# Patient Record
Sex: Female | Born: 1990 | Race: White | Hispanic: No | Marital: Married | Smoking: Never smoker
Health system: Southern US, Community
[De-identification: ages and names within clinical notes are randomized; demographics above are authoritative.]

## PROBLEM LIST (undated history)

## (undated) ENCOUNTER — Inpatient Hospital Stay (HOSPITAL_COMMUNITY): Payer: Self-pay

## (undated) ENCOUNTER — Inpatient Hospital Stay (HOSPITAL_COMMUNITY): Payer: Managed Care, Other (non HMO)

## (undated) DIAGNOSIS — F419 Anxiety disorder, unspecified: Secondary | ICD-10-CM

## (undated) DIAGNOSIS — Z9889 Other specified postprocedural states: Secondary | ICD-10-CM

## (undated) DIAGNOSIS — Z789 Other specified health status: Secondary | ICD-10-CM

## (undated) DIAGNOSIS — Z98891 History of uterine scar from previous surgery: Secondary | ICD-10-CM

## (undated) DIAGNOSIS — Z3493 Encounter for supervision of normal pregnancy, unspecified, third trimester: Secondary | ICD-10-CM

## (undated) DIAGNOSIS — Z9851 Tubal ligation status: Secondary | ICD-10-CM

## (undated) HISTORY — PX: APPENDECTOMY: SHX54

---
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
## 2010-04-22 ENCOUNTER — Ambulatory Visit (HOSPITAL_COMMUNITY): Payer: Self-pay | Admitting: Psychiatry

## 2010-07-06 ENCOUNTER — Ambulatory Visit (HOSPITAL_COMMUNITY): Payer: Self-pay | Admitting: Psychiatry

## 2010-08-23 ENCOUNTER — Ambulatory Visit (HOSPITAL_COMMUNITY): Payer: Self-pay | Admitting: Psychiatry

## 2010-10-25 ENCOUNTER — Encounter (HOSPITAL_COMMUNITY): Payer: Self-pay | Admitting: Psychiatry

## 2010-10-28 ENCOUNTER — Other Ambulatory Visit (HOSPITAL_COMMUNITY): Payer: Self-pay | Admitting: Obstetrics and Gynecology

## 2010-10-28 DIAGNOSIS — O2690 Pregnancy related conditions, unspecified, unspecified trimester: Secondary | ICD-10-CM

## 2010-10-29 ENCOUNTER — Encounter (HOSPITAL_COMMUNITY): Payer: Self-pay

## 2010-10-29 ENCOUNTER — Other Ambulatory Visit (HOSPITAL_COMMUNITY): Payer: Self-pay | Admitting: Obstetrics and Gynecology

## 2010-10-29 ENCOUNTER — Ambulatory Visit (HOSPITAL_COMMUNITY)
Admission: RE | Admit: 2010-10-29 | Discharge: 2010-10-29 | Disposition: A | Discharge status: Home / Self Care | Payer: Medicaid Other | Source: Ambulatory Visit | Attending: Obstetrics and Gynecology | Admitting: Obstetrics and Gynecology

## 2010-10-29 ENCOUNTER — Other Ambulatory Visit (HOSPITAL_COMMUNITY): Payer: Medicaid Other

## 2010-10-29 DIAGNOSIS — O358XX0 Maternal care for other (suspected) fetal abnormality and damage, not applicable or unspecified: Secondary | ICD-10-CM | POA: Insufficient documentation

## 2010-10-29 DIAGNOSIS — O2690 Pregnancy related conditions, unspecified, unspecified trimester: Secondary | ICD-10-CM

## 2010-10-29 DIAGNOSIS — Z3689 Encounter for other specified antenatal screening: Secondary | ICD-10-CM | POA: Insufficient documentation

## 2010-10-29 DIAGNOSIS — B668 Other specified fluke infections: Secondary | ICD-10-CM

## 2010-11-25 ENCOUNTER — Ambulatory Visit (HOSPITAL_COMMUNITY): Admission: RE | Admit: 2010-11-25 | Payer: Medicaid Other | Source: Ambulatory Visit

## 2010-11-25 ENCOUNTER — Ambulatory Visit (HOSPITAL_COMMUNITY): Payer: Medicaid Other

## 2010-11-25 ENCOUNTER — Other Ambulatory Visit (HOSPITAL_COMMUNITY): Payer: Self-pay | Admitting: Obstetrics and Gynecology

## 2010-11-25 ENCOUNTER — Ambulatory Visit (HOSPITAL_COMMUNITY)
Admission: RE | Admit: 2010-11-25 | Discharge: 2010-11-25 | Disposition: A | Discharge status: Home / Self Care | Payer: Medicaid Other | Source: Ambulatory Visit | Attending: Obstetrics and Gynecology | Admitting: Obstetrics and Gynecology

## 2010-11-25 DIAGNOSIS — Z1389 Encounter for screening for other disorder: Secondary | ICD-10-CM | POA: Insufficient documentation

## 2010-11-25 DIAGNOSIS — Z363 Encounter for antenatal screening for malformations: Secondary | ICD-10-CM | POA: Insufficient documentation

## 2010-11-25 DIAGNOSIS — B668 Other specified fluke infections: Secondary | ICD-10-CM

## 2010-11-25 DIAGNOSIS — O358XX0 Maternal care for other (suspected) fetal abnormality and damage, not applicable or unspecified: Secondary | ICD-10-CM | POA: Insufficient documentation

## 2010-12-01 ENCOUNTER — Other Ambulatory Visit (HOSPITAL_COMMUNITY): Payer: Medicaid Other

## 2010-12-31 ENCOUNTER — Inpatient Hospital Stay (HOSPITAL_COMMUNITY): Payer: Medicaid Other

## 2010-12-31 ENCOUNTER — Inpatient Hospital Stay (HOSPITAL_COMMUNITY)
Admission: AD | Admit: 2010-12-31 | Discharge: 2010-12-31 | Disposition: A | Discharge status: Home / Self Care | Payer: Medicaid Other | Source: Ambulatory Visit | Attending: Obstetrics and Gynecology | Admitting: Obstetrics and Gynecology

## 2010-12-31 DIAGNOSIS — O209 Hemorrhage in early pregnancy, unspecified: Secondary | ICD-10-CM | POA: Insufficient documentation

## 2011-01-06 ENCOUNTER — Other Ambulatory Visit (HOSPITAL_COMMUNITY): Payer: Self-pay | Admitting: Obstetrics and Gynecology

## 2011-01-06 ENCOUNTER — Ambulatory Visit (HOSPITAL_COMMUNITY)
Admission: RE | Admit: 2011-01-06 | Discharge: 2011-01-06 | Disposition: A | Discharge status: Home / Self Care | Payer: Medicaid Other | Source: Ambulatory Visit | Attending: Obstetrics and Gynecology | Admitting: Obstetrics and Gynecology

## 2011-01-06 DIAGNOSIS — O358XX0 Maternal care for other (suspected) fetal abnormality and damage, not applicable or unspecified: Secondary | ICD-10-CM | POA: Insufficient documentation

## 2011-01-06 DIAGNOSIS — Z3689 Encounter for other specified antenatal screening: Secondary | ICD-10-CM | POA: Insufficient documentation

## 2011-01-06 DIAGNOSIS — B668 Other specified fluke infections: Secondary | ICD-10-CM

## 2011-02-03 ENCOUNTER — Ambulatory Visit (HOSPITAL_COMMUNITY)
Admission: RE | Admit: 2011-02-03 | Discharge: 2011-02-03 | Disposition: A | Discharge status: Home / Self Care | Payer: Medicaid Other | Source: Ambulatory Visit | Attending: Obstetrics and Gynecology | Admitting: Obstetrics and Gynecology

## 2011-02-03 ENCOUNTER — Other Ambulatory Visit (HOSPITAL_COMMUNITY): Payer: Self-pay | Admitting: Obstetrics and Gynecology

## 2011-02-03 DIAGNOSIS — O358XX0 Maternal care for other (suspected) fetal abnormality and damage, not applicable or unspecified: Secondary | ICD-10-CM | POA: Insufficient documentation

## 2011-02-03 DIAGNOSIS — B668 Other specified fluke infections: Secondary | ICD-10-CM

## 2011-02-08 ENCOUNTER — Inpatient Hospital Stay (HOSPITAL_COMMUNITY)
Admission: AD | Admit: 2011-02-08 | Discharge: 2011-02-08 | Disposition: A | Discharge status: Home / Self Care | Payer: Medicaid Other | Source: Ambulatory Visit | Attending: Obstetrics and Gynecology | Admitting: Obstetrics and Gynecology

## 2011-02-08 ENCOUNTER — Other Ambulatory Visit (HOSPITAL_COMMUNITY): Payer: Self-pay | Admitting: Obstetrics and Gynecology

## 2011-02-08 ENCOUNTER — Ambulatory Visit (HOSPITAL_COMMUNITY): Payer: Medicaid Other

## 2011-02-08 ENCOUNTER — Ambulatory Visit (HOSPITAL_COMMUNITY)
Admission: RE | Admit: 2011-02-08 | Discharge: 2011-02-08 | Disposition: A | Discharge status: Home / Self Care | Payer: Medicaid Other | Source: Ambulatory Visit | Attending: Obstetrics and Gynecology | Admitting: Obstetrics and Gynecology

## 2011-02-08 DIAGNOSIS — O99891 Other specified diseases and conditions complicating pregnancy: Secondary | ICD-10-CM | POA: Insufficient documentation

## 2011-02-08 DIAGNOSIS — O4100X0 Oligohydramnios, unspecified trimester, not applicable or unspecified: Secondary | ICD-10-CM | POA: Insufficient documentation

## 2011-02-08 DIAGNOSIS — B668 Other specified fluke infections: Secondary | ICD-10-CM

## 2011-02-08 DIAGNOSIS — O358XX0 Maternal care for other (suspected) fetal abnormality and damage, not applicable or unspecified: Secondary | ICD-10-CM | POA: Insufficient documentation

## 2011-02-08 DIAGNOSIS — O365990 Maternal care for other known or suspected poor fetal growth, unspecified trimester, not applicable or unspecified: Secondary | ICD-10-CM | POA: Insufficient documentation

## 2011-02-08 LAB — AMNISURE RUPTURE OF MEMBRANE (ROM) NOT AT ARMC: Amnisure ROM: NEGATIVE

## 2011-02-09 ENCOUNTER — Inpatient Hospital Stay (HOSPITAL_COMMUNITY)
Admission: AD | Admit: 2011-02-09 | Discharge: 2011-02-09 | Disposition: A | Discharge status: Home / Self Care | Payer: Medicaid Other | Source: Ambulatory Visit | Attending: Obstetrics and Gynecology | Admitting: Obstetrics and Gynecology

## 2011-02-09 DIAGNOSIS — O4700 False labor before 37 completed weeks of gestation, unspecified trimester: Secondary | ICD-10-CM | POA: Insufficient documentation

## 2011-02-15 ENCOUNTER — Other Ambulatory Visit (HOSPITAL_COMMUNITY): Payer: Medicaid Other

## 2011-02-15 ENCOUNTER — Ambulatory Visit (HOSPITAL_COMMUNITY)
Admission: RE | Admit: 2011-02-15 | Discharge: 2011-02-15 | Disposition: A | Discharge status: Home / Self Care | Payer: Medicaid Other | Source: Ambulatory Visit | Attending: Obstetrics and Gynecology | Admitting: Obstetrics and Gynecology

## 2011-02-15 DIAGNOSIS — B668 Other specified fluke infections: Secondary | ICD-10-CM

## 2011-02-15 DIAGNOSIS — O365990 Maternal care for other known or suspected poor fetal growth, unspecified trimester, not applicable or unspecified: Secondary | ICD-10-CM | POA: Insufficient documentation

## 2011-02-15 DIAGNOSIS — O4100X0 Oligohydramnios, unspecified trimester, not applicable or unspecified: Secondary | ICD-10-CM | POA: Insufficient documentation

## 2011-02-15 DIAGNOSIS — O358XX0 Maternal care for other (suspected) fetal abnormality and damage, not applicable or unspecified: Secondary | ICD-10-CM | POA: Insufficient documentation

## 2011-02-25 ENCOUNTER — Other Ambulatory Visit (HOSPITAL_COMMUNITY): Payer: Self-pay | Admitting: Obstetrics and Gynecology

## 2011-02-25 ENCOUNTER — Other Ambulatory Visit (HOSPITAL_COMMUNITY): Payer: Medicaid Other

## 2011-02-25 ENCOUNTER — Ambulatory Visit (HOSPITAL_COMMUNITY)
Admission: RE | Admit: 2011-02-25 | Discharge: 2011-02-25 | Disposition: A | Discharge status: Home / Self Care | Payer: Medicaid Other | Source: Ambulatory Visit | Attending: Obstetrics and Gynecology | Admitting: Obstetrics and Gynecology

## 2011-02-25 DIAGNOSIS — O358XX0 Maternal care for other (suspected) fetal abnormality and damage, not applicable or unspecified: Secondary | ICD-10-CM | POA: Insufficient documentation

## 2011-02-25 DIAGNOSIS — O4100X0 Oligohydramnios, unspecified trimester, not applicable or unspecified: Secondary | ICD-10-CM | POA: Insufficient documentation

## 2011-02-25 DIAGNOSIS — B668 Other specified fluke infections: Secondary | ICD-10-CM

## 2011-02-25 DIAGNOSIS — O365990 Maternal care for other known or suspected poor fetal growth, unspecified trimester, not applicable or unspecified: Secondary | ICD-10-CM | POA: Insufficient documentation

## 2011-02-25 DIAGNOSIS — O2690 Pregnancy related conditions, unspecified, unspecified trimester: Secondary | ICD-10-CM

## 2011-03-01 ENCOUNTER — Ambulatory Visit (HOSPITAL_COMMUNITY)
Admission: RE | Admit: 2011-03-01 | Discharge: 2011-03-01 | Disposition: A | Discharge status: Home / Self Care | Payer: Medicaid Other | Source: Ambulatory Visit | Attending: Obstetrics and Gynecology | Admitting: Obstetrics and Gynecology

## 2011-03-01 ENCOUNTER — Inpatient Hospital Stay (HOSPITAL_COMMUNITY)
Admission: AD | Admit: 2011-03-01 | Discharge: 2011-03-01 | Disposition: A | Discharge status: Home / Self Care | Payer: Medicaid Other | Source: Ambulatory Visit | Attending: Obstetrics and Gynecology | Admitting: Obstetrics and Gynecology

## 2011-03-01 DIAGNOSIS — B668 Other specified fluke infections: Secondary | ICD-10-CM

## 2011-03-01 DIAGNOSIS — F411 Generalized anxiety disorder: Secondary | ICD-10-CM | POA: Insufficient documentation

## 2011-03-01 DIAGNOSIS — O358XX0 Maternal care for other (suspected) fetal abnormality and damage, not applicable or unspecified: Secondary | ICD-10-CM | POA: Insufficient documentation

## 2011-03-01 DIAGNOSIS — O99340 Other mental disorders complicating pregnancy, unspecified trimester: Secondary | ICD-10-CM | POA: Insufficient documentation

## 2011-03-01 DIAGNOSIS — O4100X0 Oligohydramnios, unspecified trimester, not applicable or unspecified: Secondary | ICD-10-CM | POA: Insufficient documentation

## 2011-03-01 DIAGNOSIS — N390 Urinary tract infection, site not specified: Secondary | ICD-10-CM | POA: Insufficient documentation

## 2011-03-01 DIAGNOSIS — O2390 Unspecified genitourinary tract infection in pregnancy, unspecified trimester: Secondary | ICD-10-CM | POA: Insufficient documentation

## 2011-03-01 DIAGNOSIS — O365990 Maternal care for other known or suspected poor fetal growth, unspecified trimester, not applicable or unspecified: Secondary | ICD-10-CM | POA: Insufficient documentation

## 2011-03-01 LAB — URINE MICROSCOPIC-ADD ON

## 2011-03-01 LAB — URINALYSIS, ROUTINE W REFLEX MICROSCOPIC
Bilirubin Urine: NEGATIVE
Glucose, UA: NEGATIVE mg/dL
Ketones, ur: NEGATIVE mg/dL
Nitrite: NEGATIVE
pH: 6.5 (ref 5.0–8.0)

## 2011-03-02 ENCOUNTER — Other Ambulatory Visit (HOSPITAL_COMMUNITY): Payer: Medicaid Other

## 2011-03-03 LAB — URINE CULTURE

## 2011-03-04 ENCOUNTER — Other Ambulatory Visit (HOSPITAL_COMMUNITY): Payer: Self-pay | Admitting: Obstetrics and Gynecology

## 2011-03-04 ENCOUNTER — Ambulatory Visit (HOSPITAL_COMMUNITY)
Admission: RE | Admit: 2011-03-04 | Discharge: 2011-03-04 | Disposition: A | Discharge status: Home / Self Care | Payer: Medicaid Other | Source: Ambulatory Visit | Attending: Obstetrics and Gynecology | Admitting: Obstetrics and Gynecology

## 2011-03-04 DIAGNOSIS — Z3689 Encounter for other specified antenatal screening: Secondary | ICD-10-CM | POA: Insufficient documentation

## 2011-03-04 DIAGNOSIS — O4100X0 Oligohydramnios, unspecified trimester, not applicable or unspecified: Secondary | ICD-10-CM | POA: Insufficient documentation

## 2011-03-04 DIAGNOSIS — O358XX0 Maternal care for other (suspected) fetal abnormality and damage, not applicable or unspecified: Secondary | ICD-10-CM | POA: Insufficient documentation

## 2011-03-04 DIAGNOSIS — O2690 Pregnancy related conditions, unspecified, unspecified trimester: Secondary | ICD-10-CM

## 2011-03-04 DIAGNOSIS — O365990 Maternal care for other known or suspected poor fetal growth, unspecified trimester, not applicable or unspecified: Secondary | ICD-10-CM | POA: Insufficient documentation

## 2011-03-11 ENCOUNTER — Other Ambulatory Visit (HOSPITAL_COMMUNITY): Payer: Medicaid Other

## 2011-03-11 ENCOUNTER — Inpatient Hospital Stay (HOSPITAL_COMMUNITY)
Admission: AD | Admit: 2011-03-11 | Discharge: 2011-03-11 | Disposition: A | Discharge status: Home / Self Care | Payer: Medicaid Other | Source: Ambulatory Visit | Attending: Obstetrics and Gynecology | Admitting: Obstetrics and Gynecology

## 2011-03-11 ENCOUNTER — Ambulatory Visit (HOSPITAL_COMMUNITY)
Admission: RE | Admit: 2011-03-11 | Discharge: 2011-03-11 | Disposition: A | Discharge status: Home / Self Care | Payer: Medicaid Other | Source: Ambulatory Visit | Attending: Obstetrics and Gynecology | Admitting: Obstetrics and Gynecology

## 2011-03-11 DIAGNOSIS — O358XX0 Maternal care for other (suspected) fetal abnormality and damage, not applicable or unspecified: Secondary | ICD-10-CM | POA: Insufficient documentation

## 2011-03-11 DIAGNOSIS — O4100X0 Oligohydramnios, unspecified trimester, not applicable or unspecified: Secondary | ICD-10-CM | POA: Insufficient documentation

## 2011-03-11 DIAGNOSIS — O2690 Pregnancy related conditions, unspecified, unspecified trimester: Secondary | ICD-10-CM

## 2011-03-11 DIAGNOSIS — O365990 Maternal care for other known or suspected poor fetal growth, unspecified trimester, not applicable or unspecified: Secondary | ICD-10-CM | POA: Insufficient documentation

## 2011-03-11 DIAGNOSIS — O368390 Maternal care for abnormalities of the fetal heart rate or rhythm, unspecified trimester, not applicable or unspecified: Secondary | ICD-10-CM | POA: Insufficient documentation

## 2011-03-17 ENCOUNTER — Other Ambulatory Visit (HOSPITAL_COMMUNITY): Payer: Medicaid Other

## 2011-03-18 ENCOUNTER — Other Ambulatory Visit (HOSPITAL_COMMUNITY): Payer: Medicaid Other

## 2011-03-18 ENCOUNTER — Ambulatory Visit (HOSPITAL_COMMUNITY): Payer: Medicaid Other

## 2011-03-27 ENCOUNTER — Inpatient Hospital Stay (HOSPITAL_COMMUNITY)
Admission: AD | Admit: 2011-03-27 | Discharge: 2011-03-27 | Disposition: A | Discharge status: Home / Self Care | Payer: Medicaid Other | Source: Ambulatory Visit | Attending: Obstetrics and Gynecology | Admitting: Obstetrics and Gynecology

## 2011-03-27 ENCOUNTER — Encounter (HOSPITAL_COMMUNITY): Payer: Self-pay | Admitting: *Deleted

## 2011-03-27 DIAGNOSIS — O368190 Decreased fetal movements, unspecified trimester, not applicable or unspecified: Secondary | ICD-10-CM

## 2011-03-27 DIAGNOSIS — O358XX0 Maternal care for other (suspected) fetal abnormality and damage, not applicable or unspecified: Secondary | ICD-10-CM | POA: Diagnosis present

## 2011-03-27 DIAGNOSIS — Z3689 Encounter for other specified antenatal screening: Secondary | ICD-10-CM

## 2011-03-27 HISTORY — DX: Other specified health status: Z78.9
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
## 2011-03-27 NOTE — Progress Notes (Signed)
Pt states she has had decreased fetal movement and hasnt felt baby move for 2-3 hrs.  G1p0, Denies ROM or vaginal bleeding
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
## 2011-06-28 ENCOUNTER — Encounter (HOSPITAL_COMMUNITY): Payer: Self-pay | Admitting: *Deleted

## 2011-10-16 IMAGING — US US FETAL BPP W/O NONSTRESS
1 series · 14 of 19 positions shown · non-contrast
Comparison: none

[Series 1: us fetal bpp w/o nonstress · 19 acquisitions, 14 frames shown]
[im 1/19]
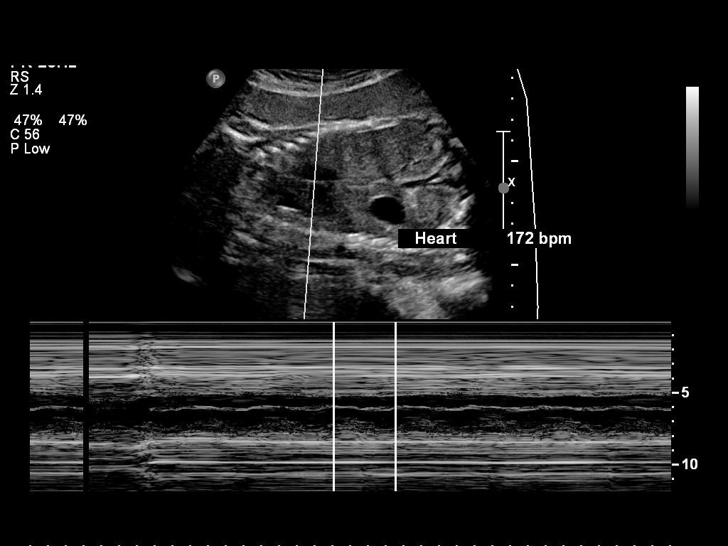
[im 3/19]
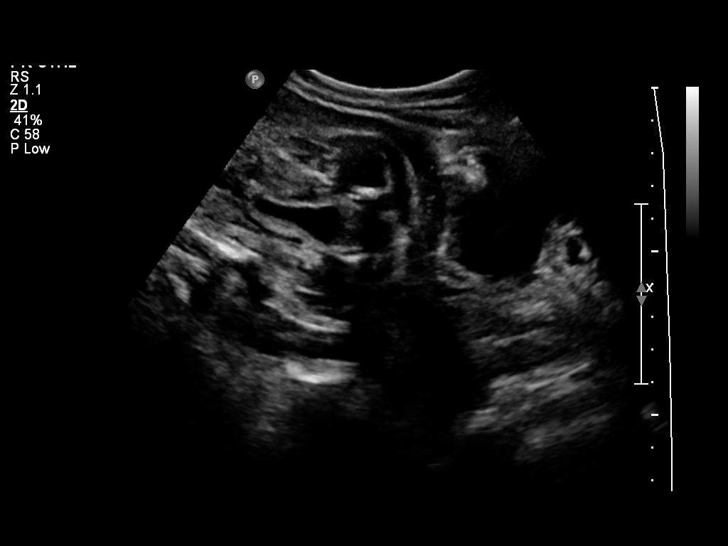
[im 4/19]
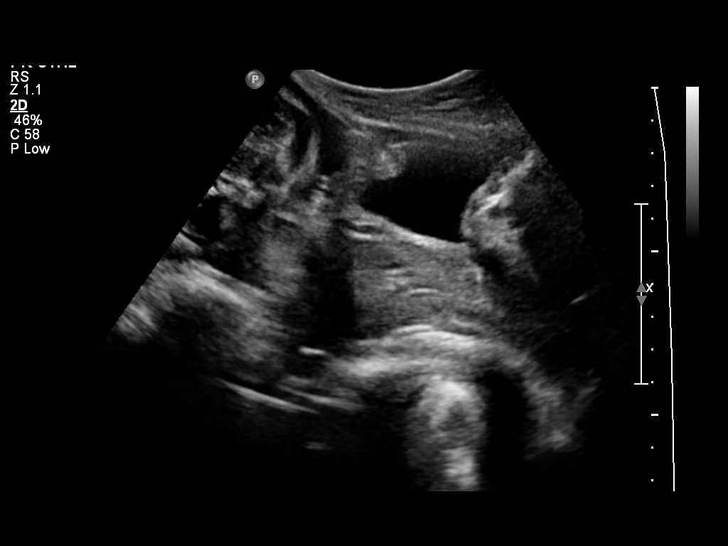
[im 5/19]
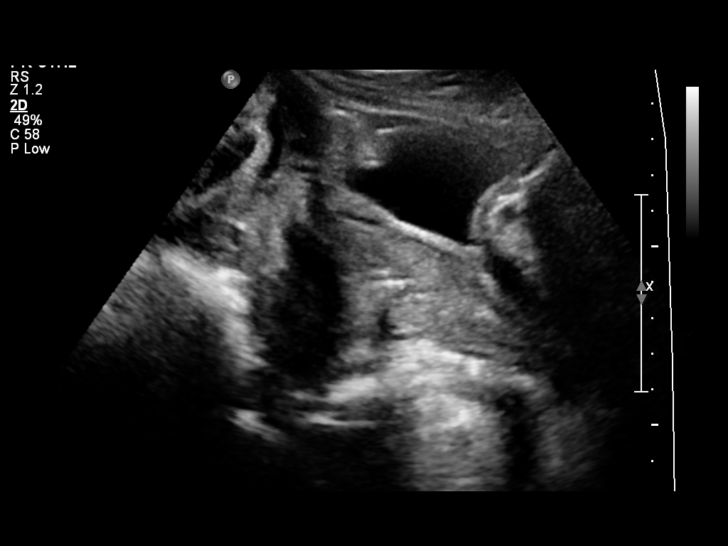
[im 7/19]
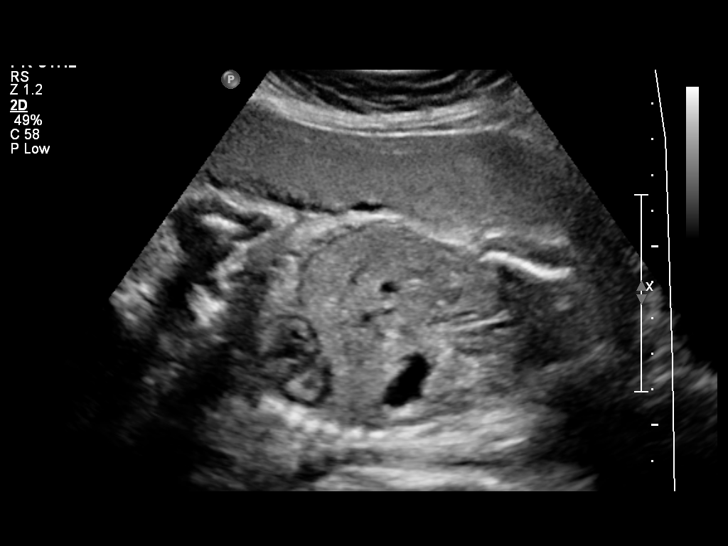
[im 8/19]
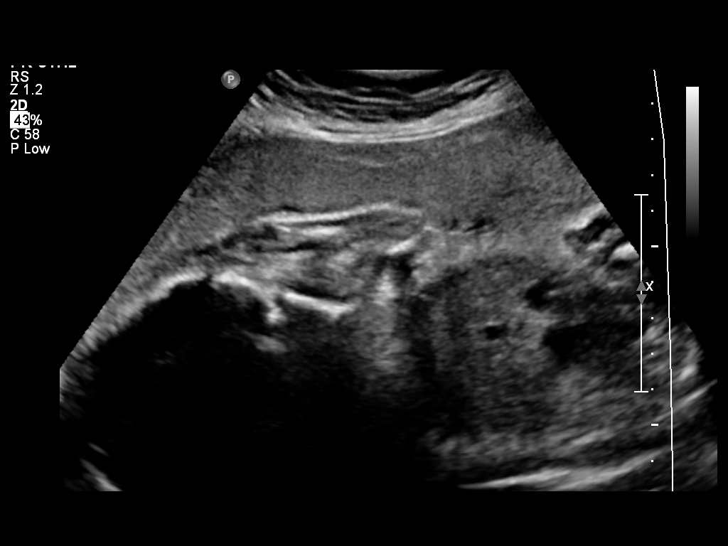
[im 9/19]
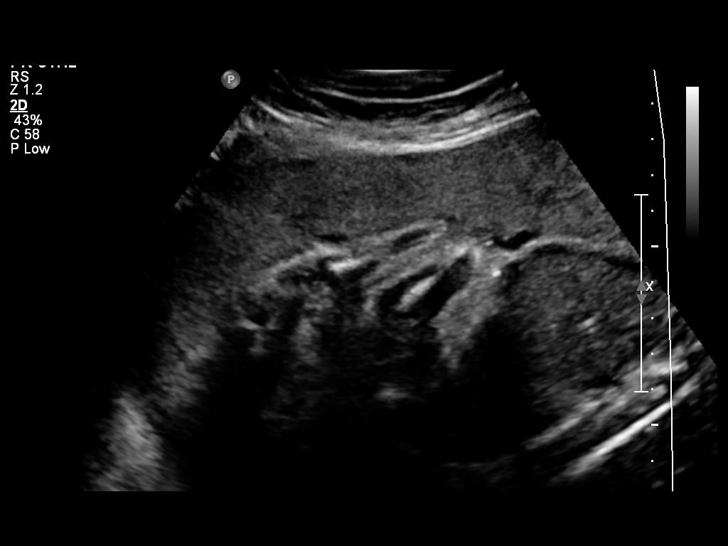
[im 11/19]
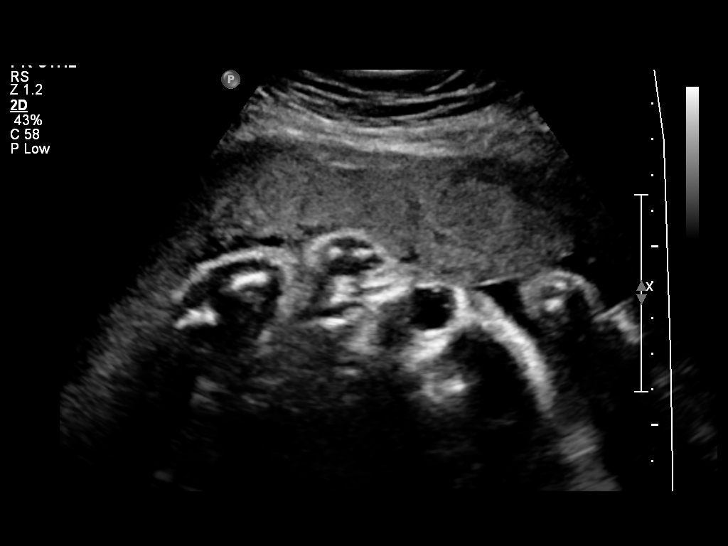
[im 12/19]
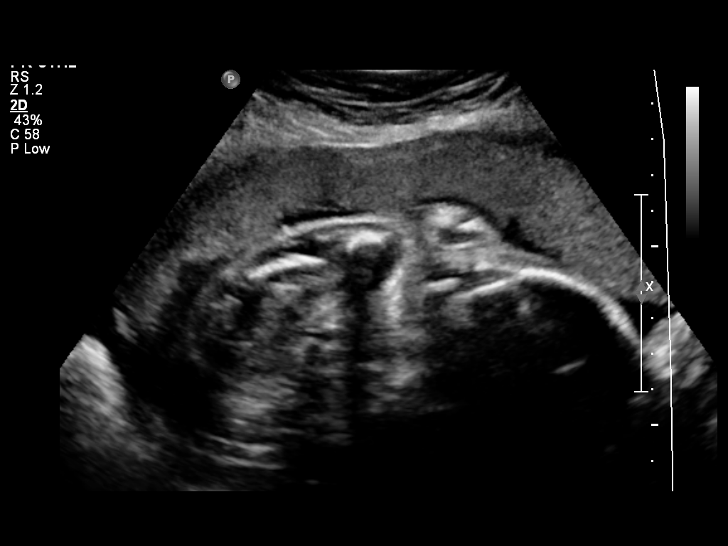
[im 13/19]
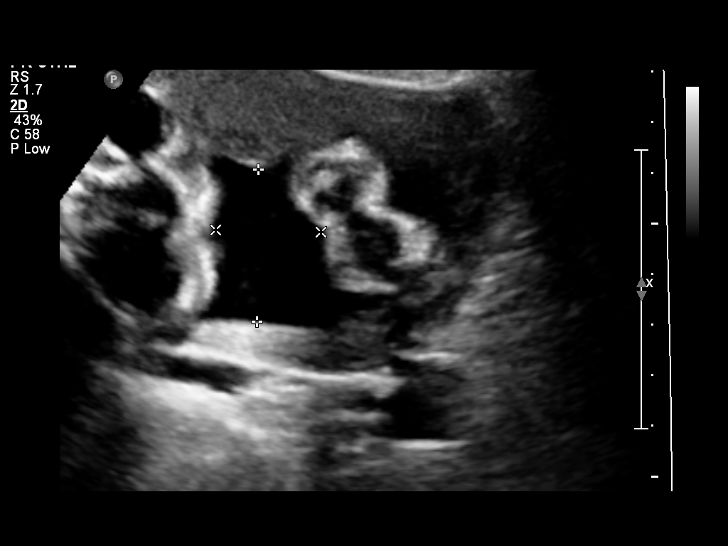
[im 15/19]
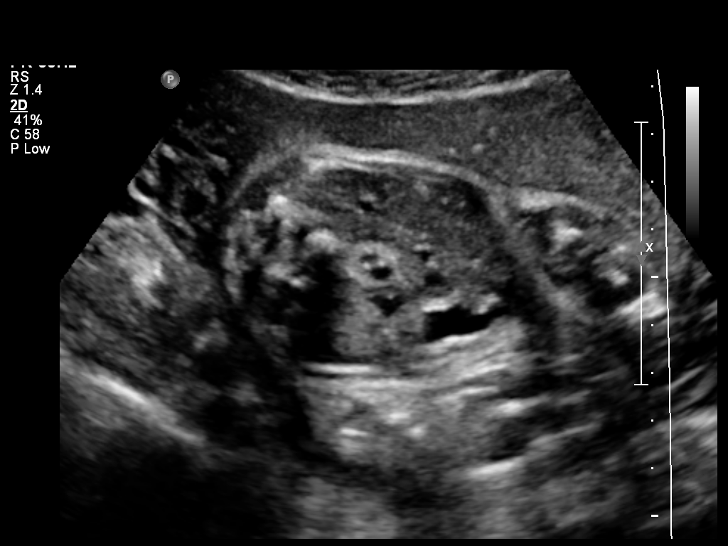
[im 16/19]
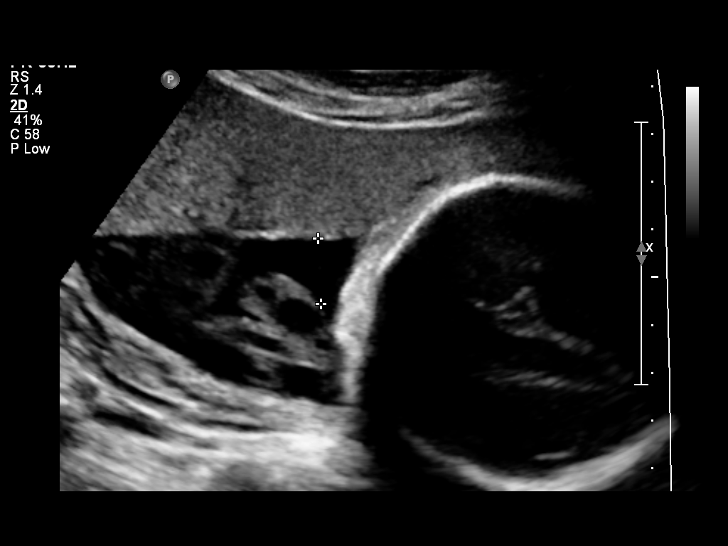
[im 17/19]
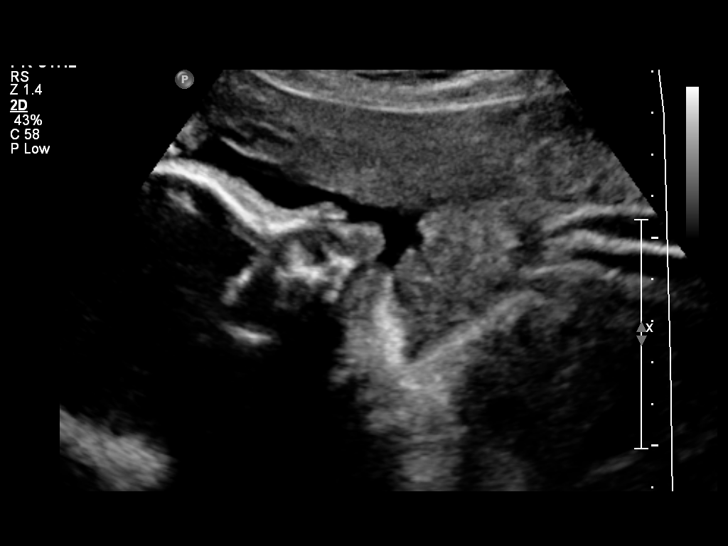
[im 19/19]
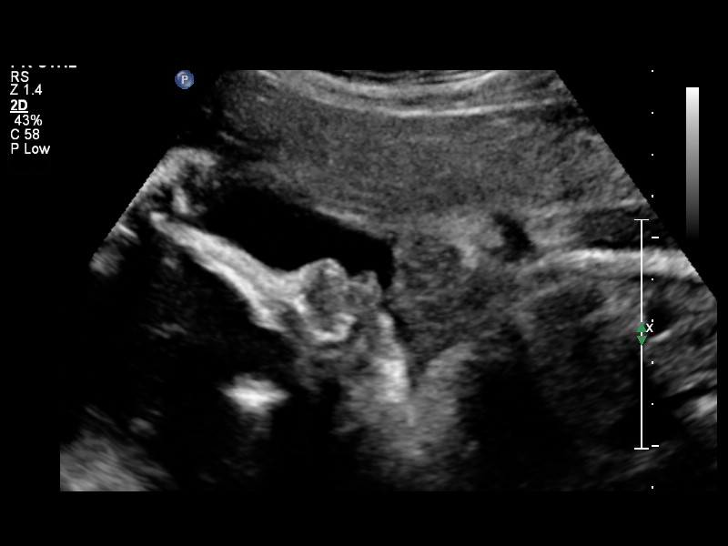

[14 of 19 positions shown; findings below may reference images not displayed]

Canned report from images found in remote index.

Refer to host system for actual result text.

## 2011-10-26 IMAGING — US US FETAL BPP W/O NONSTRESS
1 series · 14 of 19 positions shown · non-contrast
Comparison: none

[Series 1: us fetal bpp w/o nonstress · 0.25mm/px · 19 acquisitions, 14 frames shown]
[im 1/19]
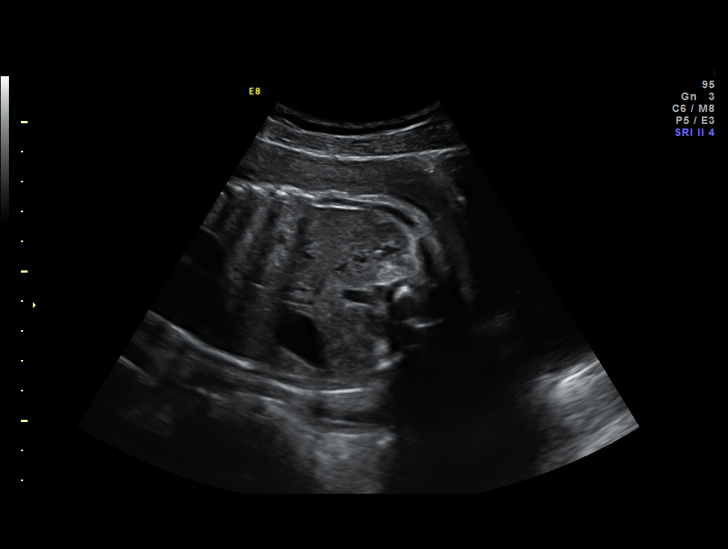
[im 3/19]
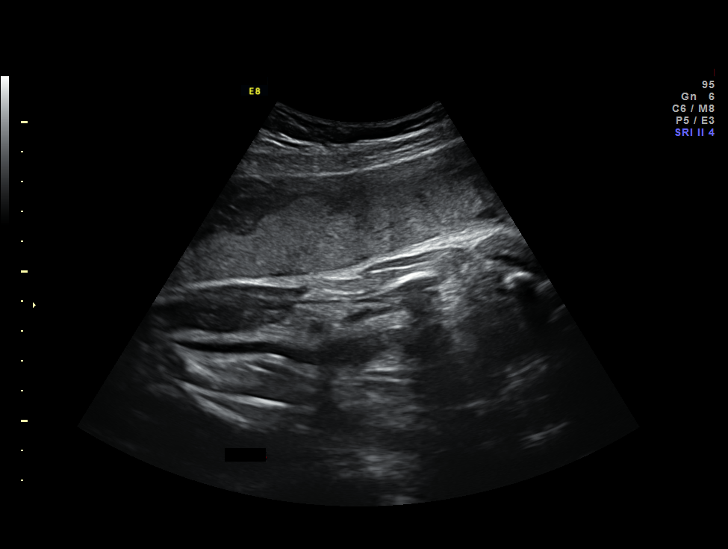
[im 4/19]
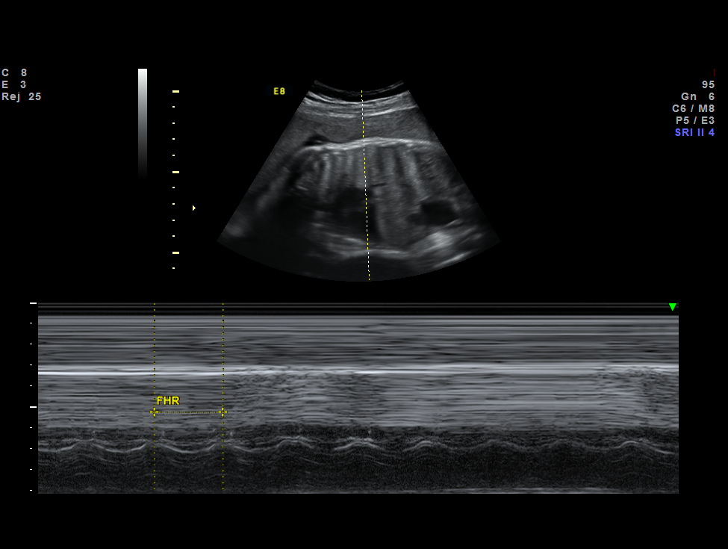
[im 5/19]
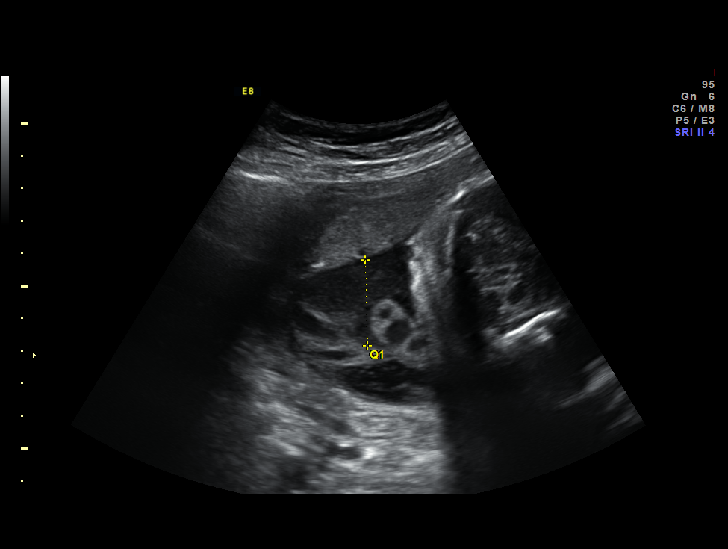
[im 7/19]
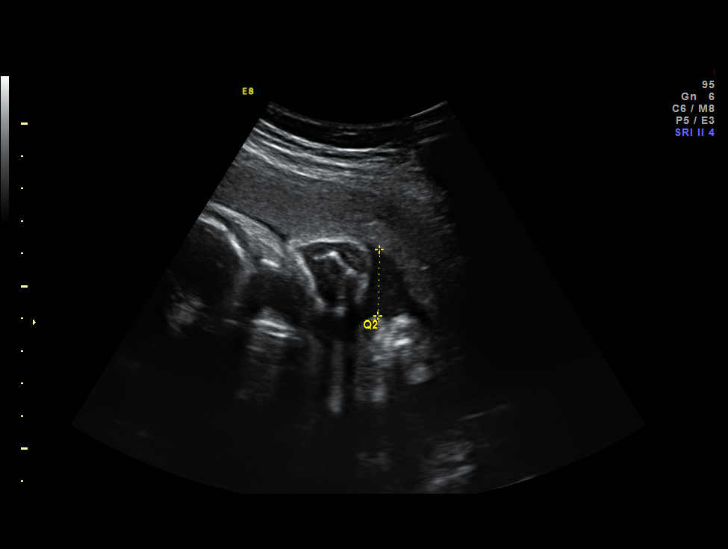
[im 8/19]
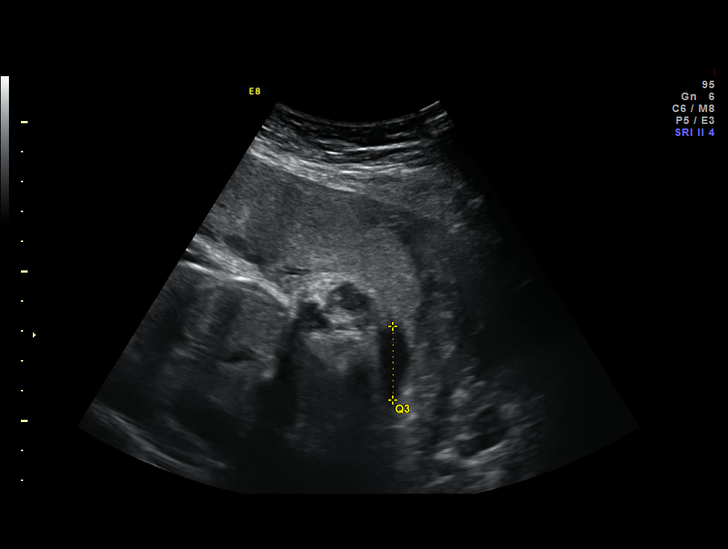
[im 9/19]
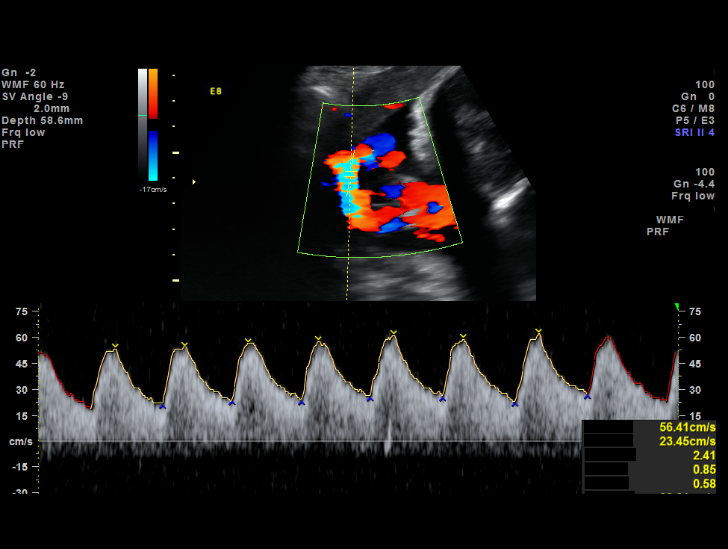
[im 11/19]
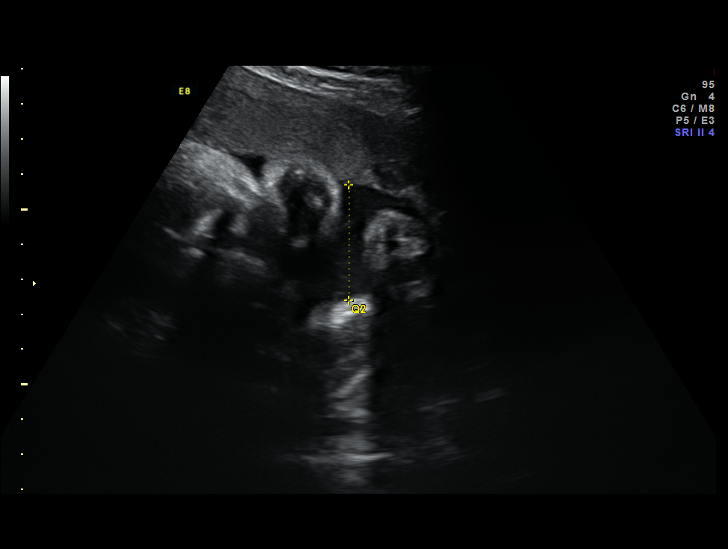
[im 12/19]
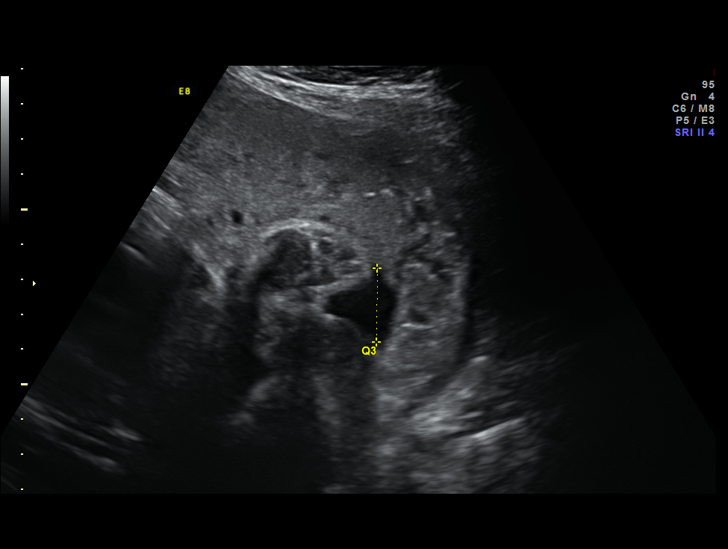
[im 13/19]
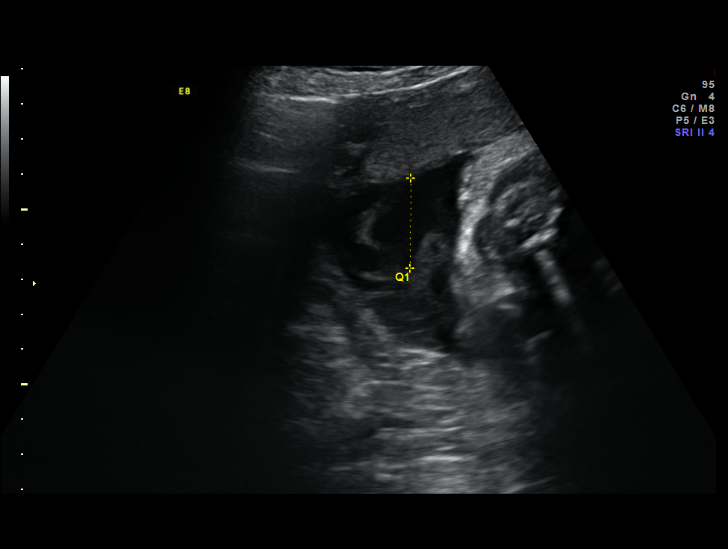
[im 15/19]
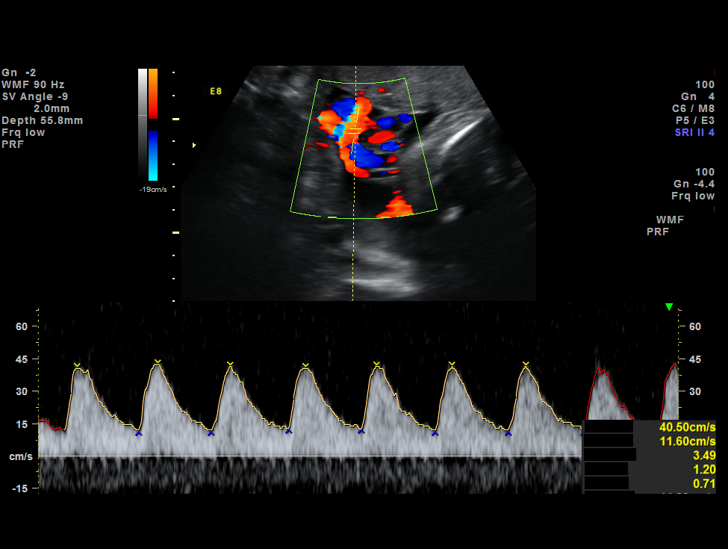
[im 16/19]
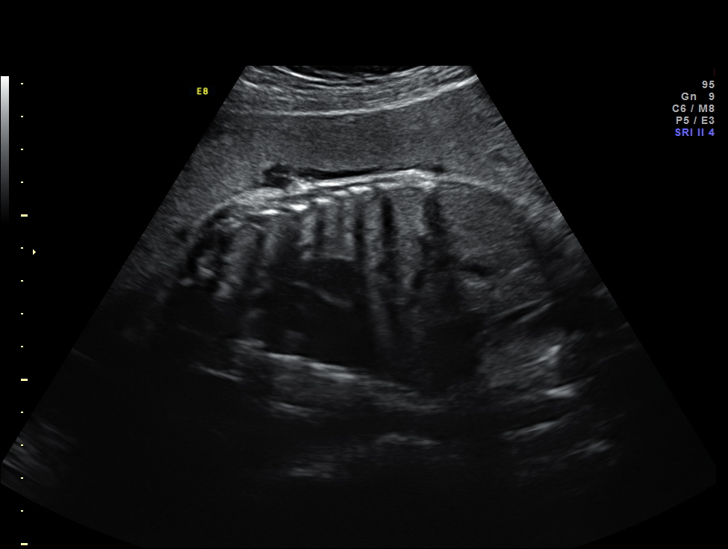
[im 17/19]
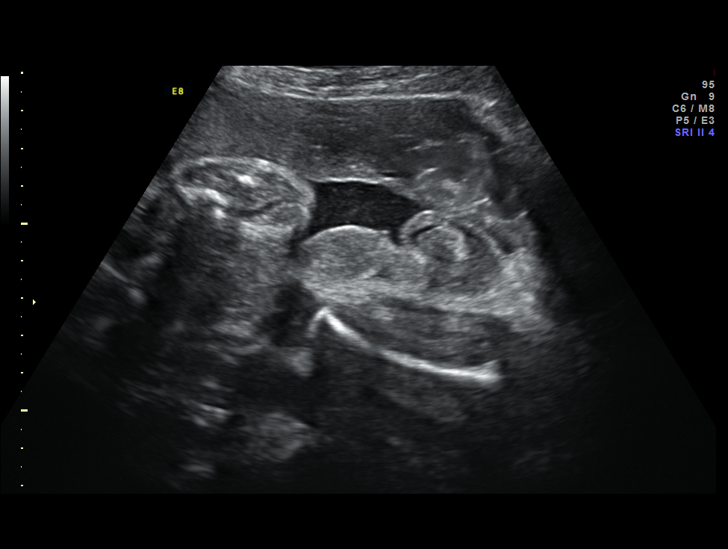
[im 19/19]
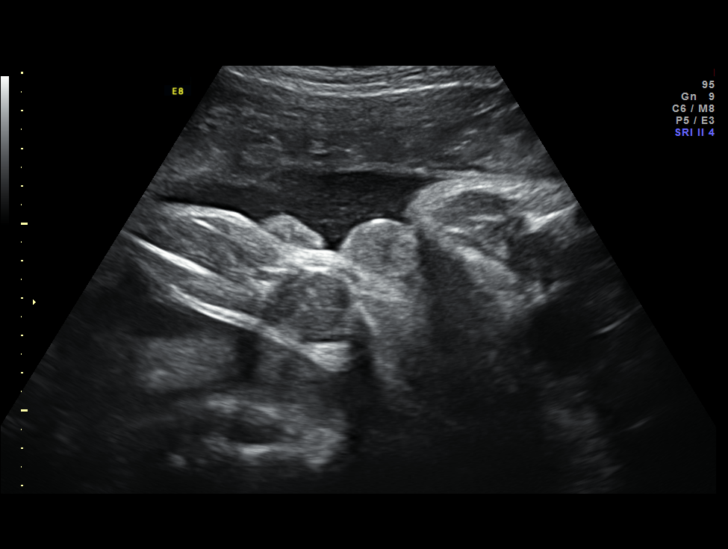

[14 of 19 positions shown; findings below may reference images not displayed]

Canned report from images found in remote index.

Refer to host system for actual result text.

## 2012-07-05 ENCOUNTER — Inpatient Hospital Stay (HOSPITAL_COMMUNITY): Payer: Medicaid Other

## 2012-07-05 ENCOUNTER — Encounter (HOSPITAL_COMMUNITY): Payer: Self-pay

## 2012-07-05 ENCOUNTER — Inpatient Hospital Stay (HOSPITAL_COMMUNITY)
Admission: AD | Admit: 2012-07-05 | Discharge: 2012-07-05 | Disposition: A | Discharge status: Home / Self Care | Payer: Medicaid Other | Source: Ambulatory Visit | Attending: Obstetrics and Gynecology | Admitting: Obstetrics and Gynecology

## 2012-07-05 DIAGNOSIS — N390 Urinary tract infection, site not specified: Secondary | ICD-10-CM | POA: Insufficient documentation

## 2012-07-05 DIAGNOSIS — R109 Unspecified abdominal pain: Secondary | ICD-10-CM

## 2012-07-05 LAB — CBC WITH DIFFERENTIAL/PLATELET
Basophils Absolute: 0 10*3/uL (ref 0.0–0.1)
HCT: 36.6 % (ref 36.0–46.0)
Hemoglobin: 12.2 g/dL (ref 12.0–15.0)
Lymphocytes Relative: 16 % (ref 12–46)
Monocytes Absolute: 1.1 10*3/uL — ABNORMAL HIGH (ref 0.1–1.0)
Neutro Abs: 8.1 10*3/uL — ABNORMAL HIGH (ref 1.7–7.7)
Neutrophils Relative %: 72 % (ref 43–77)
RDW: 13.3 % (ref 11.5–15.5)
WBC: 11.2 10*3/uL — ABNORMAL HIGH (ref 4.0–10.5)

## 2012-07-05 LAB — URINE MICROSCOPIC-ADD ON

## 2012-07-05 LAB — URINALYSIS, ROUTINE W REFLEX MICROSCOPIC
Glucose, UA: NEGATIVE mg/dL
Specific Gravity, Urine: 1.025 (ref 1.005–1.030)
pH: 6 (ref 5.0–8.0)

## 2012-07-05 LAB — WET PREP, GENITAL: Yeast Wet Prep HPF POC: NONE SEEN

## 2012-07-05 LAB — POCT PREGNANCY, URINE: Preg Test, Ur: NEGATIVE

## 2012-07-05 MED ORDER — HYDROCODONE-ACETAMINOPHEN 5-325 MG PO TABS: 1.0000 | ORAL_TABLET | Freq: Four times a day (QID) | ORAL | Status: DC | PRN

## 2012-07-05 MED ORDER — NITROFURANTOIN MONOHYD MACRO 100 MG PO CAPS: 100.0000 mg | ORAL_CAPSULE | Freq: Two times a day (BID) | ORAL | Status: DC

## 2012-07-05 MED ORDER — KETOROLAC TROMETHAMINE 60 MG/2ML IM SOLN
60.0000 mg | Freq: Once | INTRAMUSCULAR | Status: AC
  Administered 2012-07-05: 60 mg via INTRAMUSCULAR
  Filled 2012-07-05: qty 2

## 2012-07-05 NOTE — MAU Provider Note (Signed)
History     CSN: [id number]  Arrival date and time: 07/05/12 1125   First Provider Initiated Contact with Patient 07/05/12 1237      Chief Complaint  Patient presents with  . Abdominal Pain   HPIBrittain Ardeth [name] is 21 y.o. [id number] Unknown weeks presenting with sharp sudden pain 36 hrs ago.  Patient is tearful.  LMP 06/19/12.  Normal cycle for her.  They are trying to conceive.  Nausea without vomiting.  Decreased appetite-ate this am but thinks the pain is so bad she doesn't think to eat.  Last ate a pack of crackers at 11am.  Denies diarrhea or constipation, abnormal bleeding or discharge.  Denies history GI, Urologic or Gynecologic conditions.  Hx of emergency C-Section at [redacted]w[redacted]d at [facility].       Past Medical History  Diagnosis Date  . No pertinent past medical history     Past Surgical History  Procedure Date  . Cesarean section     Emergent    Family History  Problem Relation Age of Onset  . Other Neg Hx     History  Substance Use Topics  . Smoking status: Never Smoker   . Smokeless tobacco: Never Used  . Alcohol Use: No    Allergies: No Known Allergies  Prescriptions prior to admission  Medication Sig Dispense Refill  . acetaminophen (TYLENOL) 325 MG tablet Take 650 mg by mouth daily as needed. Patient takes this medication for headache.       Marland Kitchen acetaminophen (TYLENOL) 500 MG chewable tablet Chew 1,000 mg by mouth daily as needed.        Marland Kitchen IRON-VITAMINS PO Take 1 tablet by mouth daily.        . prenatal vitamin w/FE, FA (PRENATAL 1 + 1) 27-1 MG TABS Take 1 tablet by mouth daily.          Review of Systems  Constitutional: Negative for fever and chills.  HENT: Negative.   Respiratory: Negative.   Cardiovascular: Negative.   Gastrointestinal: Positive for abdominal pain.  Genitourinary: Negative for dysuria, urgency, frequency and hematuria.       Neg for abnormal bleeding or discharge  Musculoskeletal: Positive for back pain.   Physical Exam    Blood pressure 125/81, pulse 105, temperature 98.1 F (36.7 C), temperature source Oral, resp. rate 20, height 5\' 4"  (1.626 m), weight 76.658 kg (169 lb), last menstrual period 06/19/2012, not currently breastfeeding.  Physical Exam  Constitutional: She is oriented to person, place, and time. She appears well-developed and well-nourished. She appears distressed (uncomfortable).  HENT:  Head: Normocephalic.  Neck: Normal range of motion.  Cardiovascular: Normal rate.   Respiratory: Effort normal.  GI: Soft. She exhibits no distension and no mass. There is tenderness (right sided with point tenderness). There is rebound and guarding.       Negative for flank pain  Genitourinary: There is no tenderness on the right labia. There is tenderness on the left labia. Uterus is tender. Uterus is not enlarged. Right adnexum displays tenderness (moderate ). Right adnexum displays no mass and no fullness. Left adnexum displays tenderness (moderate). Left adnexum displays no mass and no fullness. No tenderness or bleeding around the vagina. No vaginal discharge found.  Neurological: She is alert and oriented to person, place, and time.  Skin: Skin is warm and dry.    Results for orders placed during the hospital encounter of 07/05/12 (from the past 24 hour(s))  URINALYSIS, ROUTINE W REFLEX MICROSCOPIC  Status: Abnormal   Collection Time   07/05/12 12:03 PM      Component Value Range   Color, Urine YELLOW  YELLOW   APPearance CLOUDY (*) CLEAR   Specific Gravity, Urine 1.025  1.005 - 1.030   pH 6.0  5.0 - 8.0   Glucose, UA NEGATIVE  NEGATIVE mg/dL   Hgb urine dipstick MODERATE (*) NEGATIVE   Bilirubin Urine NEGATIVE  NEGATIVE   Ketones, ur NEGATIVE  NEGATIVE mg/dL   Protein, ur NEGATIVE  NEGATIVE mg/dL   Urobilinogen, UA 0.2  0.0 - 1.0 mg/dL   Nitrite POSITIVE (*) NEGATIVE   Leukocytes, UA LARGE (*) NEGATIVE  URINE MICROSCOPIC-ADD ON     Status: Abnormal   Collection Time   07/05/12 12:03  PM      Component Value Range   Squamous Epithelial / LPF MANY (*) RARE   WBC, UA TOO NUMEROUS TO COUNT  <3 WBC/hpf   RBC / HPF 0-2  <3 RBC/hpf   Bacteria, UA MANY (*) RARE  POCT PREGNANCY, URINE     Status: Normal   Collection Time   07/05/12 12:12 PM      Component Value Range   Preg Test, Ur NEGATIVE  NEGATIVE  CBC WITH DIFFERENTIAL     Status: Abnormal   Collection Time   07/05/12 12:32 PM      Component Value Range   WBC 11.2 (*) 4.0 - 10.5 K/uL   RBC 4.19  3.87 - 5.11 MIL/uL   Hemoglobin 12.2  12.0 - 15.0 g/dL   HCT 16.1  09.6 - 04.5 %   MCV 87.4  78.0 - 100.0 fL   MCH 29.1  26.0 - 34.0 pg   MCHC 33.3  30.0 - 36.0 g/dL   RDW 40.9  81.1 - 91.4 %   Platelets 255  150 - 400 K/uL   Neutrophils Relative 72  43 - 77 %   Neutro Abs 8.1 (*) 1.7 - 7.7 K/uL   Lymphocytes Relative 16  12 - 46 %   Lymphs Abs 1.8  0.7 - 4.0 K/uL   Monocytes Relative 10  3 - 12 %   Monocytes Absolute 1.1 (*) 0.1 - 1.0 K/uL   Eosinophils Relative 1  0 - 5 %   Eosinophils Absolute 0.1  0.0 - 0.7 K/uL   Basophils Relative 0  0 - 1 %   Basophils Absolute 0.0  0.0 - 0.1 K/uL   Clinical Data: Severe right-sided pelvic pain. Negative pregnancy  test. LMP 06/19/2012.  TRANSABDOMINAL AND TRANSVAGINAL ULTRASOUND OF PELVIS  Technique: Both transabdominal and transvaginal ultrasound  examinations of the pelvis were performed. Transabdominal  technique was performed for global imaging of the pelvis including  uterus, ovaries, adnexal regions, and pelvic cul-de-sac.  It was necessary to proceed with endovaginal exam following the  transabdominal exam to visualize the endometrium and right ovarian  cystic area.  Comparison: None  Findings:  Uterus: 9.2 x 3.9 x 5.3 cm. No fibroids or other uterine mass  identified. Prior C-section scar noted.  Endometrium: Double layer thickness measures 13 mm transvaginally.  No focal lesion visualized.  Right ovary: 4.9 x 2.3 x 2.2 cm. A dominant follicle is seen in   the left ovary measuring 3.1 cm. No complex cystic or solid masses  are identified.  Left ovary: 2.3 x 2.2 x 2.6 cm. Normal appearance. No adnexal  mass identified.  Other Findings: No free fluid  IMPRESSION:  Normal study. No evidence of pelvic  mass or other significant  abnormality.  Original Report Authenticated By: Danae Orleans, M.D.    pain, evaluate for renal stones  RENAL/URINARY TRACT ULTRASOUND COMPLETE  Comparison: None.  Findings:  Right Kidney: Normal cortical thickness, echogenicity and size,  measuring 12.0 cm in length. No focal renal lesions. No echogenic  renal stones. No urinary obstruction.  Left Kidney: Normal cortical thickness, echogenicity and size,  measuring 11.3 cm in length. No focal renal lesions. No echogenic  renal stones. No urinary obstruction.  Bladder: Normal given degree of distension.  IMPRESSION:  Normal renal ultrasound. No evidence of urinary obstruction.  Original Report Authenticated By: Waynard Reeds, M.D.    MAU Course  Procedures  MDM Toradol 60mg  IM   Urine culture pending Patient returned from ultrasound stating she is feeling much better.   14:59  Consulted with Dr. Jolayne Panther.  Reported Presenting complaint, hx, lab and ultrasound reports, Vital signs.  Will treat UTI with Macrobid and instruct patient to return for further evaluation if sxs worsen.  Discussed plan of care with the patient and agrees with plan.  She understand to return to ED for worsening sxs of pain, nausea/vomiting, loss of appetite or diarrhea.  Patient is asking for something for pain but does not want anything "too strong"  Would like Vicodin rather than Percocet.  Instructed to delay additional ibuprofen until late tonight.  Assessment and Plan  A:  Abdominal Pain     Urinary Tract Infection  P:  Rx for Macrobid and Vicodin     Instructed patient to complete all the antibiotic        Increase PO fluids     Return to ED if worsening sxs  KEY,EVE  M 07/05/2012, 12:37 PM

## 2012-07-05 NOTE — MAU Note (Signed)
Pt states pain began 2 days ago, has progressively worsened, r side feels swollen, denies uti s/s. Just had menstrual cycle 2 weeks ago. Pain so sharp and severe pt has to ball up in floor.

## 2012-07-05 NOTE — MAU Note (Signed)
Pain started Tues night, woke her up.  Pain is in RLQ. Better in the morning.  Last night was bad again.  Feels like having a miscarriage again, but isn't having any bleeding.

## 2012-07-05 NOTE — MAU Provider Note (Signed)
Attestation of Attending Supervision of Advanced Practitioner (CNM/NP): Evaluation and management procedures were performed by the Advanced Practitioner under my supervision and collaboration.  I have reviewed the Advanced Practitioner's note and chart, and I agree with the management and plan.  Saintclair Schroader 07/05/2012 4:38 PM   

## 2012-07-07 LAB — URINE CULTURE: Colony Count: >=100000

## 2013-03-22 ENCOUNTER — Encounter (HOSPITAL_COMMUNITY): Payer: Self-pay | Admitting: Family Medicine

## 2013-03-22 ENCOUNTER — Emergency Department (HOSPITAL_COMMUNITY): Payer: BC Managed Care – PPO

## 2013-03-22 ENCOUNTER — Emergency Department (HOSPITAL_COMMUNITY)
Admission: EM | Admit: 2013-03-22 | Discharge: 2013-03-23 | Disposition: A | Discharge status: Home / Self Care | Payer: BC Managed Care – PPO | Attending: Emergency Medicine | Admitting: Emergency Medicine

## 2013-03-22 DIAGNOSIS — R112 Nausea with vomiting, unspecified: Secondary | ICD-10-CM | POA: Insufficient documentation

## 2013-03-22 DIAGNOSIS — R197 Diarrhea, unspecified: Secondary | ICD-10-CM | POA: Insufficient documentation

## 2013-03-22 DIAGNOSIS — R6883 Chills (without fever): Secondary | ICD-10-CM | POA: Insufficient documentation

## 2013-03-22 DIAGNOSIS — Z3202 Encounter for pregnancy test, result negative: Secondary | ICD-10-CM | POA: Insufficient documentation

## 2013-03-22 DIAGNOSIS — R109 Unspecified abdominal pain: Secondary | ICD-10-CM | POA: Insufficient documentation

## 2013-03-22 LAB — COMPREHENSIVE METABOLIC PANEL
ALT: 11 U/L (ref 0–35)
AST: 13 U/L (ref 0–37)
Albumin: 4.1 g/dL (ref 3.5–5.2)
Alkaline Phosphatase: 57 U/L (ref 39–117)
Chloride: 102 mEq/L (ref 96–112)
Potassium: 4.1 mEq/L (ref 3.5–5.1)
Total Bilirubin: 0.3 mg/dL (ref 0.3–1.2)

## 2013-03-22 LAB — CBC WITH DIFFERENTIAL/PLATELET
Basophils Absolute: 0 10*3/uL (ref 0.0–0.1)
Basophils Relative: 0 % (ref 0–1)
Hemoglobin: 13.8 g/dL (ref 12.0–15.0)
MCHC: 34.1 g/dL (ref 30.0–36.0)
Neutro Abs: 10.5 10*3/uL — ABNORMAL HIGH (ref 1.7–7.7)
Neutrophils Relative %: 88 % — ABNORMAL HIGH (ref 43–77)
RDW: 13.4 % (ref 11.5–15.5)
WBC: 11.9 10*3/uL — ABNORMAL HIGH (ref 4.0–10.5)

## 2013-03-22 MED ORDER — MORPHINE SULFATE 4 MG/ML IJ SOLN
4.0000 mg | Freq: Once | INTRAMUSCULAR | Status: AC
  Administered 2013-03-23: 4 mg via INTRAVENOUS
  Filled 2013-03-22: qty 1

## 2013-03-22 MED ORDER — MORPHINE SULFATE 4 MG/ML IJ SOLN
4.0000 mg | Freq: Once | INTRAMUSCULAR | Status: AC
  Administered 2013-03-22: 4 mg via INTRAMUSCULAR

## 2013-03-22 MED ORDER — SODIUM CHLORIDE 0.9 % IV BOLUS (SEPSIS)
1000.0000 mL | Freq: Once | INTRAVENOUS | Status: AC
  Administered 2013-03-22: 1000 mL via INTRAVENOUS

## 2013-03-22 MED ORDER — MORPHINE SULFATE 4 MG/ML IJ SOLN
4.0000 mg | Freq: Once | INTRAMUSCULAR | Status: DC
  Filled 2013-03-22: qty 1

## 2013-03-22 MED ORDER — ONDANSETRON 4 MG PO TBDP
4.0000 mg | ORAL_TABLET | Freq: Once | ORAL | Status: AC
  Administered 2013-03-22: 4 mg via ORAL
  Filled 2013-03-22: qty 1

## 2013-03-22 NOTE — ED Notes (Signed)
Patient transported to Ultrasound 

## 2013-03-22 NOTE — ED Notes (Signed)
IV team notified to attempt start after four failed attempts.  PA notified.

## 2013-03-22 NOTE — ED Provider Notes (Signed)
History    CSN: [id number] Arrival date & time 03/22/13  1943  First MD Initiated Contact with Patient 03/22/13 2050     Chief Complaint  Patient presents with  . Abdominal Pain   (Consider location/radiation/quality/duration/timing/severity/associated sxs/prior Treatment) HPI  The patient is a 22 year old female presenting to the emergency department for acute onset sharp counts upper abdominal pain without radiation. Patient rates her pain 10 out of 10 with no alleviating factors. Patient states movement and palpation aggravate pain. Pt has associated nausea, vomiting, diarrhea, chills. Denies CP, SOB, cough, vaginal bleeding or discharge. Abdominal surgical history include a C-section. LMP 03/15/13.   Past Medical History  Diagnosis Date  . No pertinent past medical history    Past Surgical History  Procedure Laterality Date  . Cesarean section      Emergent  . Cesarean section     Family History  Problem Relation Age of Onset  . Other Neg Hx    History  Substance Use Topics  . Smoking status: Never Smoker   . Smokeless tobacco: Never Used  . Alcohol Use: No   OB History   Grav Para Term Preterm Abortions TAB SAB Ect Mult Living   2 1  1 1  1   1      Obstetric Comments   Baby with gastroschesis     Review of Systems  Constitutional: Positive for chills. Negative for fever.  HENT: Negative.   Eyes: Negative.   Respiratory: Negative for shortness of breath.   Cardiovascular: Negative for chest pain.  Gastrointestinal: Positive for nausea, vomiting, abdominal pain and diarrhea. Negative for blood in stool and anal bleeding.  Endocrine: Negative.   Genitourinary: Negative for dysuria, flank pain, vaginal bleeding and vaginal discharge.  Musculoskeletal: Negative.   Skin: Negative.   Neurological: Negative for headaches.    Allergies  Review of patient's allergies indicates no known allergies.  Home Medications   Current Outpatient Rx  Name  Route  Sig   Dispense  Refill  . acetaminophen (TYLENOL) 500 MG tablet   Oral   Take 500 mg by mouth every 6 (six) hours as needed for pain.         . Prenatal Vit-Fe Fumarate-FA (PRENATAL MULTIVITAMIN) TABS   Oral   Take 1 tablet by mouth every morning.         Marland Kitchen HYDROcodone-acetaminophen (NORCO/VICODIN) 5-325 MG per tablet   Oral   Take 1 tablet by mouth every 4 (four) hours as needed for pain.   6 tablet   0   . ondansetron (ZOFRAN) 4 MG tablet   Oral   Take 1 tablet (4 mg total) by mouth every 6 (six) hours.   12 tablet   0    BP 105/60  Pulse 81  Temp(Src) 97.7 F (36.5 C) (Oral)  Resp 22  Ht 5\' 3"  (1.6 m)  Wt 160 lb (72.576 kg)  BMI 28.35 kg/m2  SpO2 100%  LMP 03/15/2013 Physical Exam  Constitutional: She is oriented to person, place, and time. She appears well-developed and well-nourished.  HENT:  Head: Normocephalic and atraumatic.  Mouth/Throat: Oropharynx is clear and moist.  Eyes: Conjunctivae are normal.  Neck: Neck supple.  Cardiovascular: Normal rate, regular rhythm and normal heart sounds.   Pulmonary/Chest: Effort normal and breath sounds normal. She exhibits no tenderness.  Abdominal: Soft. Bowel sounds are normal. There is tenderness. There is positive Murphy's sign. There is no rigidity, no rebound, no guarding and no CVA tenderness.  Neurological: She is alert and oriented to person, place, and time.  Skin: Skin is warm and dry.    ED Course  Procedures (including critical care time) Labs Reviewed  URINALYSIS, ROUTINE W REFLEX MICROSCOPIC - Abnormal; Notable for the following:    APPearance TURBID (*)    Ketones, ur 40 (*)    Leukocytes, UA MODERATE (*)    All other components within normal limits  CBC WITH DIFFERENTIAL - Abnormal; Notable for the following:    WBC 11.9 (*)    Neutrophils Relative % 88 (*)    Neutro Abs 10.5 (*)    Lymphocytes Relative 10 (*)    Monocytes Relative 2 (*)    All other components within normal limits    COMPREHENSIVE METABOLIC PANEL - Abnormal; Notable for the following:    Glucose, Bld 126 (*)    All other components within normal limits  URINE MICROSCOPIC-ADD ON - Abnormal; Notable for the following:    Squamous Epithelial / LPF FEW (*)    All other components within normal limits  LIPASE, BLOOD  PREGNANCY, URINE   US Abdomen Complete  03/23/2013   *RADIOLOGY REPORT*  Clinical Data:  Right upper quadrant pain  COMPLETE ABDOMINAL ULTRASOUND  Comparison:  Ultrasound kidneys 07/05/2012  Findings:  Gallbladder:  No gallstones, gallbladder wall thickening, or pericholecystic fluid.  Common bile duct:  Normal caliber at 3.1 mm diameter.  Liver:  No focal lesion identified.  Within normal limits in parenchymal echogenicity.  IVC:  Appears normal.  Pancreas:  No focal abnormality seen.  Spleen:  Spleen length measures 12.8 cm.  Normal parenchymal echotexture.  Right Kidney:  Right kidney measures 11.8 cm length.  No hydronephrosis.  Left Kidney:  Left kidney measures 11.9 cm length.  No hydronephrosis.  Abdominal aorta:  No aneurysm identified.  IMPRESSION: Negative abdominal ultrasound.   Original Report Authenticated By: Burman Nieves, M.D.   1. Abdominal pain   2. Nausea vomiting and diarrhea     MDM  Patient is nontoxic, nonseptic appearing, in no apparent distress.  Patient's pain and other symptoms adequately managed in emergency department.  Fluid bolus given.  Labs, imaging and vitals reviewed.  Patient does not meet the SIRS or Sepsis criteria.  On repeat exam patient does not have a surgical abdomen and there are nor peritoneal signs.  No indication of appendicitis, bowel obstruction, bowel perforation, cholecystitis, diverticulitis, PID or ectopic pregnancy.  Patient discharged home with symptomatic treatment and given strict instructions for follow-up with their primary care physician.  I have also discussed reasons to return immediately to the ER.  Patient expresses understanding and  agrees with plan. Patient d/w with Dr. Jeraldine Loots, agrees with plan. Patient is stable at time of discharge        Jeannetta Ellis, PA-C 03/23/13 0114

## 2013-03-22 NOTE — ED Notes (Signed)
Patient with vomiting, diarrhea, and mid abdominal pain since this AM.  Patient rates pain as a 10. Last menses was July 4th but was not a normal period just spotting.  Patient had two positive pregnancy tests prior to July 4th.

## 2013-03-22 NOTE — ED Notes (Signed)
Patient states that she has had abdominal pain since this morning; pain has gotten worse. Now has n/v/d. Took Tylenol this morning without relief of symptoms. Indicates upper abdomen.

## 2013-03-23 LAB — URINALYSIS, ROUTINE W REFLEX MICROSCOPIC
Ketones, ur: 40 mg/dL — AB
Nitrite: NEGATIVE
pH: 8 (ref 5.0–8.0)

## 2013-03-23 LAB — URINE MICROSCOPIC-ADD ON

## 2013-03-23 MED ORDER — ONDANSETRON HCL 4 MG PO TABS: 4.0000 mg | ORAL_TABLET | Freq: Four times a day (QID) | ORAL | Status: DC

## 2013-03-23 MED ORDER — HYDROCODONE-ACETAMINOPHEN 5-325 MG PO TABS: 1.0000 | ORAL_TABLET | ORAL | Status: DC | PRN

## 2013-03-23 NOTE — ED Provider Notes (Signed)
  Medical screening examination/treatment/procedure(s) were performed by non-physician practitioner and as supervising physician I was immediately available for consultation/collaboration.    Gerhard Munch, MD 03/23/13 [phone]

## 2013-04-16 ENCOUNTER — Encounter (HOSPITAL_COMMUNITY): Payer: Self-pay

## 2013-04-16 ENCOUNTER — Inpatient Hospital Stay (HOSPITAL_COMMUNITY): Payer: BC Managed Care – PPO

## 2013-04-16 ENCOUNTER — Inpatient Hospital Stay (HOSPITAL_COMMUNITY)
Admission: AD | Admit: 2013-04-16 | Discharge: 2013-04-16 | Disposition: A | Discharge status: Home / Self Care | Payer: BC Managed Care – PPO | Source: Ambulatory Visit | Attending: Obstetrics & Gynecology | Admitting: Obstetrics & Gynecology

## 2013-04-16 DIAGNOSIS — O99891 Other specified diseases and conditions complicating pregnancy: Secondary | ICD-10-CM | POA: Insufficient documentation

## 2013-04-16 DIAGNOSIS — R109 Unspecified abdominal pain: Secondary | ICD-10-CM

## 2013-04-16 DIAGNOSIS — O9989 Other specified diseases and conditions complicating pregnancy, childbirth and the puerperium: Secondary | ICD-10-CM

## 2013-04-16 DIAGNOSIS — O3680X0 Pregnancy with inconclusive fetal viability, not applicable or unspecified: Secondary | ICD-10-CM

## 2013-04-16 DIAGNOSIS — R1032 Left lower quadrant pain: Secondary | ICD-10-CM | POA: Insufficient documentation

## 2013-04-16 LAB — CBC
Hemoglobin: 12.9 g/dL (ref 12.0–15.0)
MCH: 28.4 pg (ref 26.0–34.0)
MCV: 84.8 fL (ref 78.0–100.0)
RBC: 4.54 MIL/uL (ref 3.87–5.11)

## 2013-04-16 LAB — URINALYSIS, ROUTINE W REFLEX MICROSCOPIC
Bilirubin Urine: NEGATIVE
Nitrite: NEGATIVE
Specific Gravity, Urine: 1.015 (ref 1.005–1.030)
Urobilinogen, UA: 0.2 mg/dL (ref 0.0–1.0)

## 2013-04-16 LAB — WET PREP, GENITAL

## 2013-04-16 NOTE — MAU Provider Note (Signed)
History     CSN: [id number]  Arrival date and time: 04/16/13 1617   First Provider Initiated Contact with Patient 04/16/13 1927      Chief Complaint  Patient presents with  . Abdominal Pain   HPI 22 y.o. [name] at [redacted]w[redacted]d presents to MAU for abdominal pain. She complains of LLQ pain, no bleeding, no vaginal discharge. Dating by LMP in early July, but says it was very light, only 2 days.   OB History   Grav Para Term Preterm Abortions TAB SAB Ect Mult Living   3 1  1 1  1   1      Obstetric Comments   Baby with gastroschesis      Past Medical History  Diagnosis Date  . No pertinent past medical history     Past Surgical History  Procedure Laterality Date  . Cesarean section      Emergent  . Cesarean section      Family History  Problem Relation Age of Onset  . Other Neg Hx     History  Substance Use Topics  . Smoking status: Never Smoker   . Smokeless tobacco: Never Used  . Alcohol Use: No    Allergies: No Known Allergies  Prescriptions prior to admission  Medication Sig Dispense Refill  . Docosahexaenoic Acid (PRENATAL DHA PO) Take 1 tablet by mouth daily.        ROS Physical Exam   Blood pressure 125/98, pulse 83, temperature 97.7 F (36.5 C), temperature source Oral, resp. rate 18, height 5\' 3"  (1.6 m), weight 167 lb 4 oz (75.864 kg), last menstrual period 03/15/2013, SpO2 100.00%.  Physical Exam  Pelvic: NAVM, small amount of white thin mucous. Nothing obviously leaking from cervix. Cervix started to bleed a small amount with GC swab.  Bimanual: Normal sized ovaries, no tenderness over ovaries. Mild tenderness when palpating uterus in midline.  MAU Course  Procedures Results for orders placed during the hospital encounter of 04/16/13 (from the past 24 hour(s))  URINALYSIS, ROUTINE W REFLEX MICROSCOPIC     Status: None   Collection Time    04/16/13  5:35 PM      Result Value Range   Color, Urine YELLOW  YELLOW   APPearance CLEAR  CLEAR   Specific Gravity, Urine 1.015  1.005 - 1.030   pH 7.0  5.0 - 8.0   Glucose, UA NEGATIVE  NEGATIVE mg/dL   Hgb urine dipstick NEGATIVE  NEGATIVE   Bilirubin Urine NEGATIVE  NEGATIVE   Ketones, ur NEGATIVE  NEGATIVE mg/dL   Protein, ur NEGATIVE  NEGATIVE mg/dL   Urobilinogen, UA 0.2  0.0 - 1.0 mg/dL   Nitrite NEGATIVE  NEGATIVE   Leukocytes, UA NEGATIVE  NEGATIVE  POCT PREGNANCY, URINE     Status: Abnormal   Collection Time    04/16/13  5:40 PM      Result Value Range   Preg Test, Ur POSITIVE (*) NEGATIVE  CBC     Status: None   Collection Time    04/16/13  6:07 PM      Result Value Range   WBC 8.8  4.0 - 10.5 K/uL   RBC 4.54  3.87 - 5.11 MIL/uL   Hemoglobin 12.9  12.0 - 15.0 g/dL   HCT 13.0  86.5 - 78.4 %   MCV 84.8  78.0 - 100.0 fL   MCH 28.4  26.0 - 34.0 pg   MCHC 33.5  30.0 - 36.0 g/dL   RDW 69.6  11.5 - 15.5 %   Platelets 272  150 - 400 K/uL  HCG, QUANTITATIVE, PREGNANCY     Status: Abnormal   Collection Time    04/16/13  6:08 PM      Result Value Range   hCG, Beta Chain, Quant, S 2516 (*) <5 mIU/mL  WET PREP, GENITAL     Status: Abnormal   Collection Time    04/16/13  7:34 PM      Result Value Range   Yeast Wet Prep HPF POC NONE SEEN  NONE SEEN   Trich, Wet Prep NONE SEEN  NONE SEEN   Clue Cells Wet Prep HPF POC NONE SEEN  NONE SEEN   WBC, Wet Prep HPF POC FEW (*) NONE SEEN    MDM Ultrasound pending    Assessment and Plan  22 y.o. [name] at [redacted]w[redacted]d   hCG quant 2516 Ultrasound pending Care turned over to [place], CNM at 20:00.  POE,DEIRDRE 04/16/2013, 7:41 PM  Exam and note by Dr. Waynetta Sandy. Evaluation and management procedures were performed by Resident physician under my supervision/collaboration. Chart reviewed, patient examined by me and I agree with management and plan.  Dorathy Kinsman, CNM assumed care of pt at 2000. Pt awaiting [place].   US Ob Comp Less 14 Wks  04/16/2013   *RADIOLOGY REPORT*  Clinical Data: Abdominal pain.  Early pregnancy.   OBSTETRIC <14 WK [place] AND TRANSVAGINAL OB US  Technique:  Both transabdominal and transvaginal ultrasound examinations were performed for complete evaluation of the gestation as well as the maternal uterus, adnexal regions, and pelvic cul-de-sac.  Transvaginal technique was performed to assess early pregnancy.  Comparison:  None.  Intrauterine gestational sac:  Single Yolk sac: No Embryo: No Cardiac Activity: No  MSD: 3.7 ml mm  4 w 6 d             [place] EDC: 12/18/2013  Maternal uterus/adnexae: No subchorionic hemorrhage.  Corpus luteum cyst on the right ovary. Normal left ovary.  Trace free fluid.  IMPRESSION: Early intrauterine pregnancy, 4 weeks 6 days gestation.  No visible embryo at this time. No visible complicating findings.   Original Report Authenticated By: Francene Boyers, M.D.   US Ob Transvaginal  04/16/2013   *RADIOLOGY REPORT*  Clinical Data: Abdominal pain.  Early pregnancy.  OBSTETRIC <14 WK [place] AND TRANSVAGINAL OB US  Technique:  Both transabdominal and transvaginal ultrasound examinations were performed for complete evaluation of the gestation as well as the maternal uterus, adnexal regions, and pelvic cul-de-sac.  Transvaginal technique was performed to assess early pregnancy.  Comparison:  None.  Intrauterine gestational sac:  Single Yolk sac: No Embryo: No Cardiac Activity: No  MSD: 3.7 ml mm  4 w 6 d             [place] EDC: 12/18/2013  Maternal uterus/adnexae: No subchorionic hemorrhage.  Corpus luteum cyst on the right ovary. Normal left ovary.  Trace free fluid.  IMPRESSION: Early intrauterine pregnancy, 4 weeks 6 days gestation.  No visible embryo at this time. No visible complicating findings.   Original Report Authenticated By: Francene Boyers, M.D.  .  Assessment: 1. Abdominal pain complicating pregnancy, antepartum   2. Pregnancy with uncertain fetal viability    Plan: D/C home in stable condition. Ectopic and SAB precautions.    Medication List         PRENATAL DHA PO  Take 1  tablet by mouth daily.       Follow-up Information   Follow  up with THE [facility] [place] MATERNITY ADMISSIONS In 2 days. (for repeat blood work or as needed if symptoms worsen)    Contact information:   [address] [id number] [place] [id number] [phone]     Dorathy Kinsman, [place] 04/16/2013 9:53 PM

## 2013-04-16 NOTE — MAU Note (Signed)
Patient is in with c/o llq pain that was intermittent before today. Now its constant. She states that her pregnancy was confirmed at the health dept. She was advised to come to MAU due to the pain. She denies vaginal bleeding or abnormal discharge. lmp 03/15/13.

## 2013-04-17 LAB — GC/CHLAMYDIA PROBE AMP: CT Probe RNA: NEGATIVE

## 2013-04-18 ENCOUNTER — Inpatient Hospital Stay (HOSPITAL_COMMUNITY)
Admission: AD | Admit: 2013-04-18 | Discharge: 2013-04-18 | Disposition: A | Discharge status: Home / Self Care | Payer: BC Managed Care – PPO | Source: Ambulatory Visit | Attending: Family Medicine | Admitting: Family Medicine

## 2013-04-18 DIAGNOSIS — O209 Hemorrhage in early pregnancy, unspecified: Secondary | ICD-10-CM | POA: Insufficient documentation

## 2013-04-18 DIAGNOSIS — Z3201 Encounter for pregnancy test, result positive: Secondary | ICD-10-CM

## 2013-04-18 LAB — HCG, QUANTITATIVE, PREGNANCY: hCG, Beta Chain, Quant, S: 5382 m[IU]/mL — ABNORMAL HIGH (ref <5)

## 2013-04-18 NOTE — MAU Provider Note (Signed)
Ms. [name] is a 22 y.o. [phone] at [redacted]w[redacted]d who presents to MAU today for follow-up quant hCG. Quant hCG on 04/16/13 was 2516. Patient denies pain or bleeding today.   LMP 03/15/2013 GENERAL: Well-developed, well-nourished female in no acute distress.  HEENT: Normocephalic, atraumatic.   LUNGS: Effort normal HEART: Regular rate  SKIN: Warm, dry and without erythema PSYCH: Normal mood and affect  Results for orders placed during the hospital encounter of 04/18/13 (from the past 24 hour(s))  HCG, QUANTITATIVE, PREGNANCY     Status: Abnormal   Collection Time    04/18/13  7:00 PM      Result Value Range   hCG, Beta Chain, Quant, S 5382 (*) <5 mIU/mL    A: Appropriate rise in quant hCG  P: Discharge home First trimester warning signs reviewed Patient will follow-up for [place] to confirm IUP on 04/25/13. [place] to call with appoitnemnt time.  Patient states that Medicaid is active and she is awaiting her card, but plans to go to [facility] ASAP Return to MAU as needed or if her condition were to change or worsen prior to scheduled follow-up  Freddi Starr, PA-C 04/18/2013 8:27 PM

## 2013-04-18 NOTE — MAU Provider Note (Signed)
Chart reviewed and agree with management and plan.  

## 2013-04-23 NOTE — MAU Provider Note (Signed)
Attestation of Attending Supervision of Advanced Practitioner (CNM/NP): Evaluation and management procedures were performed by the Advanced Practitioner under my supervision and collaboration. I have reviewed the Advanced Practitioner's note and chart, and I agree with the management and plan.  LEGGETT,KELLY H. 3:18 PM

## 2013-05-18 ENCOUNTER — Encounter (HOSPITAL_COMMUNITY): Payer: Self-pay | Admitting: Anesthesiology

## 2013-05-18 ENCOUNTER — Observation Stay (HOSPITAL_COMMUNITY)
Admission: AD | Admit: 2013-05-18 | Discharge: 2013-05-19 | Disposition: A | Discharge status: Home / Self Care | Payer: Managed Care, Other (non HMO) | Source: Ambulatory Visit | Attending: General Surgery | Admitting: General Surgery

## 2013-05-18 ENCOUNTER — Ambulatory Visit (HOSPITAL_COMMUNITY)
Admit: 2013-05-18 | Discharge: 2013-05-18 | Disposition: A | Discharge status: Home / Self Care | Payer: Managed Care, Other (non HMO) | Attending: Obstetrics and Gynecology | Admitting: Obstetrics and Gynecology

## 2013-05-18 ENCOUNTER — Encounter (HOSPITAL_COMMUNITY): Admission: AD | Disposition: A | Discharge status: Home / Self Care | Payer: Self-pay | Source: Ambulatory Visit

## 2013-05-18 ENCOUNTER — Inpatient Hospital Stay (HOSPITAL_COMMUNITY): Payer: Managed Care, Other (non HMO)

## 2013-05-18 ENCOUNTER — Inpatient Hospital Stay (HOSPITAL_COMMUNITY): Payer: Managed Care, Other (non HMO) | Admitting: Anesthesiology

## 2013-05-18 DIAGNOSIS — K352 Acute appendicitis with generalized peritonitis, without abscess: Secondary | ICD-10-CM | POA: Insufficient documentation

## 2013-05-18 DIAGNOSIS — K3580 Unspecified acute appendicitis: Secondary | ICD-10-CM | POA: Diagnosis present

## 2013-05-18 DIAGNOSIS — K35209 Acute appendicitis with generalized peritonitis, without abscess, unspecified as to perforation: Secondary | ICD-10-CM | POA: Insufficient documentation

## 2013-05-18 DIAGNOSIS — O99891 Other specified diseases and conditions complicating pregnancy: Principal | ICD-10-CM | POA: Insufficient documentation

## 2013-05-18 HISTORY — PX: LAPAROSCOPIC APPENDECTOMY: SHX408

## 2013-05-18 LAB — COMPREHENSIVE METABOLIC PANEL
Albumin: 3.7 g/dL (ref 3.5–5.2)
Alkaline Phosphatase: 59 U/L (ref 39–117)
BUN: 9 mg/dL (ref 6–23)
Calcium: 9.6 mg/dL (ref 8.4–10.5)
Creatinine, Ser: 0.58 mg/dL (ref 0.50–1.10)
Potassium: 3.6 mEq/L (ref 3.5–5.1)
Total Protein: 7.2 g/dL (ref 6.0–8.3)

## 2013-05-18 LAB — URINALYSIS, ROUTINE W REFLEX MICROSCOPIC
Bilirubin Urine: NEGATIVE
Nitrite: NEGATIVE
Protein, ur: NEGATIVE mg/dL
Specific Gravity, Urine: 1.01 (ref 1.005–1.030)
Urobilinogen, UA: 0.2 mg/dL (ref 0.0–1.0)

## 2013-05-18 LAB — URINE MICROSCOPIC-ADD ON

## 2013-05-18 LAB — AMYLASE: Amylase: 82 U/L (ref 0–105)

## 2013-05-18 LAB — CBC
MCV: 83.3 fL (ref 78.0–100.0)
Platelets: 263 10*3/uL (ref 150–400)
RDW: 13.6 % (ref 11.5–15.5)
WBC: 15.7 10*3/uL — ABNORMAL HIGH (ref 4.0–10.5)

## 2013-05-18 LAB — LIPASE, BLOOD: Lipase: 34 U/L (ref 11–59)

## 2013-05-18 SURGERY — APPENDECTOMY, LAPAROSCOPIC
Anesthesia: General | Site: Abdomen | Wound class: Contaminated

## 2013-05-18 MED ORDER — LACTATED RINGERS IV SOLN
INTRAVENOUS | Status: DC | PRN
  Administered 2013-05-18: 15:00:00 via INTRAVENOUS

## 2013-05-18 MED ORDER — LACTATED RINGERS IV BOLUS (SEPSIS)
1000.0000 mL | Freq: Once | INTRAVENOUS | Status: AC
  Administered 2013-05-18: 1000 mL via INTRAVENOUS

## 2013-05-18 MED ORDER — BUPIVACAINE-EPINEPHRINE PF 0.25-1:200000 % IJ SOLN
INTRAMUSCULAR | Status: AC
  Filled 2013-05-18: qty 30

## 2013-05-18 MED ORDER — PRENATAL MULTIVITAMIN CH
1.0000 | ORAL_TABLET | Freq: Every day | ORAL | Status: DC
  Filled 2013-05-18: qty 1

## 2013-05-18 MED ORDER — HYDROMORPHONE HCL PF 1 MG/ML IJ SOLN
INTRAMUSCULAR | Status: AC
  Filled 2013-05-18: qty 1

## 2013-05-18 MED ORDER — NEOSTIGMINE METHYLSULFATE 1 MG/ML IJ SOLN
INTRAMUSCULAR | Status: DC | PRN
  Administered 2013-05-18: 3 mg via INTRAVENOUS

## 2013-05-18 MED ORDER — HYDROMORPHONE HCL PF 1 MG/ML IJ SOLN
1.0000 mg | INTRAMUSCULAR | Status: AC
  Administered 2013-05-18: 1 mg via INTRAVENOUS
  Filled 2013-05-18: qty 1

## 2013-05-18 MED ORDER — PROPOFOL 10 MG/ML IV BOLUS
INTRAVENOUS | Status: DC | PRN
  Administered 2013-05-18: 150 mg via INTRAVENOUS

## 2013-05-18 MED ORDER — OXYCODONE-ACETAMINOPHEN 5-325 MG PO TABS
1.0000 | ORAL_TABLET | ORAL | Status: DC | PRN
  Administered 2013-05-18 – 2013-05-19 (×3): 2 via ORAL
  Administered 2013-05-19: 1 via ORAL
  Filled 2013-05-18 (×2): qty 2
  Filled 2013-05-18: qty 1

## 2013-05-18 MED ORDER — ONDANSETRON HCL 4 MG/2ML IJ SOLN: 4.0000 mg | Freq: Four times a day (QID) | INTRAMUSCULAR | Status: DC | PRN

## 2013-05-18 MED ORDER — OXYCODONE-ACETAMINOPHEN 5-325 MG PO TABS
ORAL_TABLET | ORAL | Status: AC
  Filled 2013-05-18: qty 2

## 2013-05-18 MED ORDER — KCL IN DEXTROSE-NACL 20-5-0.45 MEQ/L-%-% IV SOLN
INTRAVENOUS | Status: DC
  Administered 2013-05-18: 18:00:00 via INTRAVENOUS
  Filled 2013-05-18 (×4): qty 1000

## 2013-05-18 MED ORDER — ONDANSETRON HCL 4 MG/2ML IJ SOLN: 4.0000 mg | Freq: Once | INTRAMUSCULAR | Status: DC | PRN

## 2013-05-18 MED ORDER — HYDROMORPHONE HCL PF 1 MG/ML IJ SOLN
0.2500 mg | INTRAMUSCULAR | Status: DC | PRN
  Administered 2013-05-18 (×2): 0.5 mg via INTRAVENOUS

## 2013-05-18 MED ORDER — DEXTROSE 5 % IV SOLN
2.0000 g | INTRAVENOUS | Status: DC
  Administered 2013-05-18: 2 g via INTRAVENOUS
  Filled 2013-05-18: qty 2

## 2013-05-18 MED ORDER — SODIUM CHLORIDE 0.9 % IV SOLN
INTRAVENOUS | Status: DC | PRN
  Administered 2013-05-18: 14:00:00 via INTRAVENOUS

## 2013-05-18 MED ORDER — MORPHINE SULFATE 2 MG/ML IJ SOLN
1.0000 mg | INTRAMUSCULAR | Status: DC | PRN
  Administered 2013-05-18: 2 mg via INTRAVENOUS
  Filled 2013-05-18: qty 1

## 2013-05-18 MED ORDER — ONDANSETRON HCL 4 MG/2ML IJ SOLN
INTRAMUSCULAR | Status: DC | PRN
  Administered 2013-05-18: 4 mg via INTRAVENOUS

## 2013-05-18 MED ORDER — ONDANSETRON HCL 4 MG/2ML IJ SOLN
INTRAMUSCULAR | Status: AC
  Administered 2013-05-18: 4 mg via INTRAVENOUS
  Filled 2013-05-18: qty 2

## 2013-05-18 MED ORDER — SUCCINYLCHOLINE CHLORIDE 20 MG/ML IJ SOLN
INTRAMUSCULAR | Status: DC | PRN
  Administered 2013-05-18: 120 mg via INTRAVENOUS

## 2013-05-18 MED ORDER — SODIUM CHLORIDE 0.9 % IR SOLN
Status: DC | PRN
  Administered 2013-05-18: 1

## 2013-05-18 MED ORDER — SODIUM CHLORIDE 0.9 % IR SOLN
Status: DC | PRN
  Administered 2013-05-18: 500 mL

## 2013-05-18 MED ORDER — FENTANYL CITRATE 0.05 MG/ML IJ SOLN
INTRAMUSCULAR | Status: DC | PRN
  Administered 2013-05-18 (×4): 50 ug via INTRAVENOUS
  Administered 2013-05-18: 100 ug via INTRAVENOUS

## 2013-05-18 MED ORDER — LIDOCAINE HCL (CARDIAC) 20 MG/ML IV SOLN
INTRAVENOUS | Status: DC | PRN
  Administered 2013-05-18: 40 mg via INTRAVENOUS
  Administered 2013-05-18: 30 mg via INTRAVENOUS

## 2013-05-18 MED ORDER — ROCURONIUM BROMIDE 100 MG/10ML IV SOLN
INTRAVENOUS | Status: DC | PRN
  Administered 2013-05-18: 20 mg via INTRAVENOUS

## 2013-05-18 MED ORDER — ONDANSETRON HCL 4 MG/2ML IJ SOLN
4.0000 mg | Freq: Once | INTRAMUSCULAR | Status: AC
  Administered 2013-05-18: 4 mg via INTRAVENOUS

## 2013-05-18 MED ORDER — HEPARIN SODIUM (PORCINE) 5000 UNIT/ML IJ SOLN
5000.0000 [IU] | Freq: Three times a day (TID) | INTRAMUSCULAR | Status: DC
  Administered 2013-05-19: 5000 [IU] via SUBCUTANEOUS
  Filled 2013-05-18 (×4): qty 1

## 2013-05-18 MED ORDER — ONDANSETRON HCL 4 MG PO TABS: 4.0000 mg | ORAL_TABLET | Freq: Four times a day (QID) | ORAL | Status: DC | PRN

## 2013-05-18 MED ORDER — GLYCOPYRROLATE 0.2 MG/ML IJ SOLN
INTRAMUSCULAR | Status: DC | PRN
  Administered 2013-05-18: 0.4 mg via INTRAVENOUS

## 2013-05-18 MED ORDER — METRONIDAZOLE IN NACL 5-0.79 MG/ML-% IV SOLN
500.0000 mg | Freq: Three times a day (TID) | INTRAVENOUS | Status: DC
  Administered 2013-05-18: 500 mg via INTRAVENOUS
  Filled 2013-05-18 (×3): qty 100

## 2013-05-18 MED ORDER — KETOROLAC TROMETHAMINE 30 MG/ML IJ SOLN: 15.0000 mg | Freq: Once | INTRAMUSCULAR | Status: DC | PRN

## 2013-05-18 MED ORDER — PROMETHAZINE HCL 25 MG/ML IJ SOLN
25.0000 mg | Freq: Four times a day (QID) | INTRAMUSCULAR | Status: DC | PRN
  Administered 2013-05-18: 25 mg via INTRAVENOUS
  Filled 2013-05-18 (×2): qty 1

## 2013-05-18 MED ORDER — ONDANSETRON 8 MG PO TBDP: 8.0000 mg | ORAL_TABLET | ORAL | Status: DC

## 2013-05-18 MED ORDER — BUPIVACAINE-EPINEPHRINE 0.25% -1:200000 IJ SOLN
INTRAMUSCULAR | Status: DC | PRN
  Administered 2013-05-18: 20 mL

## 2013-05-18 SURGICAL SUPPLY — 52 items
ADH SKN CLS APL DERMABOND .7 (GAUZE/BANDAGES/DRESSINGS) ×1
ADH SKN CLS LQ APL DERMABOND (GAUZE/BANDAGES/DRESSINGS) ×1
APL SKNCLS STERI-STRIP NONHPOA (GAUZE/BANDAGES/DRESSINGS)
APPLIER CLIP ROT 10 11.4 M/L (STAPLE)
APR CLP MED LRG 11.4X10 (STAPLE)
BAG SPEC RTRVL LRG 6X4 10 (ENDOMECHANICALS) ×1
BENZOIN TINCTURE PRP APPL 2/3 (GAUZE/BANDAGES/DRESSINGS) IMPLANT
BLADE SURG ROTATE 9660 (MISCELLANEOUS) IMPLANT
CANISTER SUCTION 2500CC (MISCELLANEOUS) ×2 IMPLANT
CHLORAPREP W/TINT 26ML (MISCELLANEOUS) ×2 IMPLANT
CLIP APPLIE ROT 10 11.4 M/L (STAPLE) IMPLANT
CLOTH BEACON ORANGE TIMEOUT ST (SAFETY) ×2 IMPLANT
COVER SURGICAL LIGHT HANDLE (MISCELLANEOUS) ×2 IMPLANT
CUTTER FLEX LINEAR 45M (STAPLE) ×2 IMPLANT
DECANTER SPIKE VIAL GLASS SM (MISCELLANEOUS) ×2 IMPLANT
DERMABOND ADHESIVE PROPEN (GAUZE/BANDAGES/DRESSINGS) ×1
DERMABOND ADVANCED (GAUZE/BANDAGES/DRESSINGS) ×1
DERMABOND ADVANCED .7 DNX12 (GAUZE/BANDAGES/DRESSINGS) IMPLANT
DERMABOND ADVANCED .7 DNX6 (GAUZE/BANDAGES/DRESSINGS) IMPLANT
DRAPE UTILITY 15X26 W/TAPE STR (DRAPE) ×4 IMPLANT
ELECT REM PT RETURN 9FT ADLT (ELECTROSURGICAL) ×2
ELECTRODE REM PT RTRN 9FT ADLT (ELECTROSURGICAL) ×1 IMPLANT
ENDOLOOP SUT PDS II  0 18 (SUTURE)
ENDOLOOP SUT PDS II 0 18 (SUTURE) IMPLANT
GAUZE SPONGE 2X2 8PLY STRL LF (GAUZE/BANDAGES/DRESSINGS) IMPLANT
GLOVE BIOGEL M STRL SZ7.5 (GLOVE) ×2 IMPLANT
GLOVE BIOGEL PI IND STRL 8 (GLOVE) ×1 IMPLANT
GLOVE BIOGEL PI INDICATOR 8 (GLOVE) ×1
GOWN STRL NON-REIN LRG LVL3 (GOWN DISPOSABLE) ×4 IMPLANT
GOWN STRL REIN XL XLG (GOWN DISPOSABLE) ×2 IMPLANT
KIT BASIN OR (CUSTOM PROCEDURE TRAY) ×2 IMPLANT
KIT ROOM TURNOVER OR (KITS) ×2 IMPLANT
NS IRRIG 1000ML POUR BTL (IV SOLUTION) ×2 IMPLANT
PAD ARMBOARD 7.5X6 YLW CONV (MISCELLANEOUS) ×4 IMPLANT
POUCH SPECIMEN RETRIEVAL 10MM (ENDOMECHANICALS) ×2 IMPLANT
RELOAD 45 VASCULAR/THIN (ENDOMECHANICALS) ×2 IMPLANT
RELOAD STAPLE 45 2.5 WHT GRN (ENDOMECHANICALS) IMPLANT
RELOAD STAPLE 45 3.5 BLU ETS (ENDOMECHANICALS) IMPLANT
RELOAD STAPLE TA45 3.5 REG BLU (ENDOMECHANICALS) IMPLANT
SCALPEL HARMONIC ACE (MISCELLANEOUS) ×2 IMPLANT
SCISSORS LAP 5X35 DISP (ENDOMECHANICALS) IMPLANT
SET IRRIG TUBING LAPAROSCOPIC (IRRIGATION / IRRIGATOR) ×2 IMPLANT
SPECIMEN JAR SMALL (MISCELLANEOUS) ×2 IMPLANT
SPONGE GAUZE 2X2 STER 10/PKG (GAUZE/BANDAGES/DRESSINGS)
SUT MNCRL AB 4-0 PS2 18 (SUTURE) ×2 IMPLANT
SUT VICRYL 0 UR6 27IN ABS (SUTURE) IMPLANT
TOWEL OR 17X24 6PK STRL BLUE (TOWEL DISPOSABLE) ×2 IMPLANT
TOWEL OR 17X26 10 PK STRL BLUE (TOWEL DISPOSABLE) ×2 IMPLANT
TRAY FOLEY CATH 16FR SILVER (SET/KITS/TRAYS/PACK) ×2 IMPLANT
TRAY LAPAROSCOPIC (CUSTOM PROCEDURE TRAY) ×2 IMPLANT
TROCAR XCEL BLADELESS 5X75MML (TROCAR) ×4 IMPLANT
TROCAR XCEL BLUNT TIP 100MML (ENDOMECHANICALS) ×2 IMPLANT

## 2013-05-18 NOTE — MAU Note (Signed)
Onset of generalized upper abd pain at 2000 last night, vomiting with pain. Pain is sharp and comes in waves.

## 2013-05-18 NOTE — ED Notes (Signed)
MD at bedside. VSS.

## 2013-05-18 NOTE — Op Note (Signed)
Appendectomy, Lap, Procedure Note  Indications: The patient presented with a history of right-sided abdominal pain. An MRI revealed findings consistent with acute appendicitis. The patient has a 9 week intrauterine pregnancy. We discussed the risk of pregnancy loss both with and without surgery. Please see my h&p for full discussion regarding risk/benefits of procedure  Pre-operative Diagnosis: Acute appendicitis with localized peritonitis  Post-operative Diagnosis: Same  Surgeon: Atilano Ina   Assistants: none  Anesthesia: General endotracheal anesthesia  ASA Class: 2, Emergent  Procedure Details  The patient was seen again in the Holding Room. The risks, benefits, complications, treatment options, and expected outcomes were discussed with the patient and/or family. The possibilities of perforation of viscus, bleeding, recurrent infection, the need for additional procedures, failure to diagnose a condition, and creating a complication requiring transfusion or operation were discussed. There was concurrence with the proposed plan and informed consent was obtained. The site of surgery was properly noted. The patient was taken to Operating Room, identified as [name] and the procedure verified as Appendectomy. A Time Out was held and the above information confirmed. The patient received IV antibiotics prior to skin incision.  The patient was placed in the supine position and general anesthesia was induced, along with placement of orogastric tube, SCDs, and a Foley catheter. The abdomen was prepped and draped in a sterile fashion. A 1.5 centimeter infraumbilical incision was made.  The umbilical stalk was elevated, and the midline fascia was incised with a #11 blade.  A Kelly clamp was used to confirm entrance into the peritoneal cavity.  A pursestring suture was passed around the incision with a 0 Vicryl.  A 12mm Hasson was introduced into the abdomen and the tails of the suture were used  to hold the Hasson in place.   The pneumoperitoneum was then established to steady pressure of 15 mmHg.  Additional 5 mm cannulas then placed in the left lower quadrant of the abdomen and the suprapubic region under direct visualization. A careful evaluation of the entire abdomen was carried out. The patient was placed in Trendelenburg and left lateral decubitus position. The small intestines were retracted in the cephalad and left lateral direction away from the pelvis and right lower quadrant. The patient was found to have an inflammed appendix that was extending into the pelvis. There was no evidence of perforation. There was trace yellowish fluid just next to appendix in paracolic gutter. The uterus was visualized and grossly appeared normal for a 9 week pregnancy.   The appendix was carefully dissected. The appendix was was skeletonized with the harmonic scalpel.   The appendix was divided at its base using an endo-GIA stapler with a white load. No appendiceal stump was left in place. The appendix was removed from the abdomen with an Endocatch bag through the umbilical port.  There was no evidence of bleeding, leakage, or complication after division of the appendix. Irrigation was also performed and irrigate suctioned from the abdomen as well.  The umbilical port site was closed with the purse string suture. The closure was viewed laparoscopically. There was no residual palpable fascial defect.  The trocar site skin wounds were closed with 4-0 Monocryl. Dermabond was applied to the skin incisions.  Instrument, sponge, and needle counts were correct at the conclusion of the case.   Findings: The appendix was found to be inflamed. There were not signs of necrosis.  There was not perforation. There was abscess formation.  Estimated Blood Loss:  Minimal  Drains: none         Specimens: appendix         Complications:  None; patient tolerated the procedure well.         Disposition: PACU  - hemodynamically stable.         Condition: stable  [name]. Cindy Campanile, MD, FACS General, Bariatric, & Minimally Invasive Surgery [facility], [place]

## 2013-05-18 NOTE — Transfer of Care (Signed)
Immediate Anesthesia Transfer of Care Note  Patient: [name]  Procedure(s) Performed: Procedure(s): APPENDECTOMY LAPAROSCOPIC (N/A)  Patient Location: PACU  Anesthesia Type:General  Level of Consciousness: awake, alert , oriented, patient cooperative and responds to stimulation  Airway & Oxygen Therapy: Patient Spontanous Breathing and Patient connected to nasal cannula oxygen  Post-op Assessment: Report given to PACU RN, Post -op Vital signs reviewed and stable, Patient moving all extremities and Patient moving all extremities X 4  Post vital signs: Reviewed and stable  Complications: No apparent anesthesia complications

## 2013-05-18 NOTE — Anesthesia Preprocedure Evaluation (Signed)
Anesthesia Evaluation  Patient identified by MRN, date of birth, ID band Patient awake    Reviewed: Allergy & Precautions, H&P , NPO status , Patient's Chart, lab work & pertinent test results  Airway Mallampati: I      Dental  (+) Teeth Intact and Dental Advisory Given   Pulmonary  breath sounds clear to auscultation        Cardiovascular Rhythm:Regular Rate:Normal     Neuro/Psych    GI/Hepatic   Endo/Other    Renal/GU      Musculoskeletal   Abdominal   Peds  Hematology   Anesthesia Other Findings   Reproductive/Obstetrics                           Anesthesia Physical Anesthesia Plan  ASA: II and emergent  Anesthesia Plan: General   Post-op Pain Management:    Induction: Intravenous, Rapid sequence and Cricoid pressure planned  Airway Management Planned: Oral ETT  Additional Equipment:   Intra-op Plan:   Post-operative Plan: Extubation in OR  Informed Consent: I have reviewed the patients History and Physical, chart, labs and discussed the procedure including the risks, benefits and alternatives for the proposed anesthesia with the patient or authorized representative who has indicated his/her understanding and acceptance.   Dental advisory given  Plan Discussed with: CRNA and Anesthesiologist  Anesthesia Plan Comments: (Acute appendicitis [redacted] weeks pregnant  Plan GA with RSI  Kipp Brood, MD)        Anesthesia Quick Evaluation

## 2013-05-18 NOTE — MAU Provider Note (Signed)
Chief Complaint: Abdominal Pain   First Provider Initiated Contact with Patient 05/18/13 0522     SUBJECTIVE HPI: [name] is a 22 y.o. [id number] at [redacted]w[redacted]d by LMP who presents to maternity admissions via EMS with abdominal pain described as upper and lower abdominal pain that is sharp, severe, and "comes in waves".  The pain started at 8 pm last night after eating [place] food. She reports she has had this pain in the past with her miscarriage, with kidney stones, and with a GI virus.  She is also vomiting, with onset after the pain started.  She feels like she is vomiting because of the pain.  She denies vaginal bleeding, vaginal itching/burning, urinary symptoms, h/a, dizziness, or fever/chills.           Past Medical History  Diagnosis Date  . No pertinent past medical history    Past Surgical History  Procedure Laterality Date  . Cesarean section      Emergent  . Cesarean section     History   Social History  . Marital Status: Married    Spouse Name: N/A    Number of Children: N/A  . Years of Education: N/A   Occupational History  . Not on file.   Social History Main Topics  . Smoking status: Never Smoker   . Smokeless tobacco: Never Used  . Alcohol Use: No  . Drug Use: No  . Sexual Activity: Yes    Birth Control/ Protection: None   Other Topics Concern  . Not on file   Social History Narrative  . No narrative on file   No current facility-administered medications on file prior to encounter.   No current outpatient prescriptions on file prior to encounter.   No Known Allergies  ROS: Pertinent items in HPI  OBJECTIVE Blood pressure 123/66, pulse 83, temperature 98.2 F (36.8 C), temperature source Oral, resp. rate 18, last menstrual period 03/15/2013, SpO2 99.00%. GENERAL: Well-developed, well-nourished female in no acute distress.  HEENT: Normocephalic HEART: normal rate RESP: normal effort ABDOMEN: Soft, diffusely tender from lower to upper abdomen  on R and L sides with mild guarding, no rebound tenderness EXTREMITIES: Nontender, no edema NEURO: Alert and oriented  LAB RESULTS Results for orders placed during the hospital encounter of 05/18/13 (from the past 24 hour(s))  URINALYSIS, ROUTINE W REFLEX MICROSCOPIC     Status: Abnormal   Collection Time    05/18/13  5:00 AM      Result Value Range   Color, Urine YELLOW  YELLOW   APPearance CLEAR  CLEAR   Specific Gravity, Urine 1.010  1.005 - 1.030   pH 7.5  5.0 - 8.0   Glucose, UA NEGATIVE  NEGATIVE mg/dL   Hgb urine dipstick TRACE (*) NEGATIVE   Bilirubin Urine NEGATIVE  NEGATIVE   Ketones, ur 15 (*) NEGATIVE mg/dL   Protein, ur NEGATIVE  NEGATIVE mg/dL   Urobilinogen, UA 0.2  0.0 - 1.0 mg/dL   Nitrite NEGATIVE  NEGATIVE   Leukocytes, UA TRACE (*) NEGATIVE  URINE MICROSCOPIC-ADD ON     Status: Abnormal   Collection Time    05/18/13  5:00 AM      Result Value Range   Squamous Epithelial / LPF RARE  RARE   WBC, UA 0-2  <3 WBC/hpf   RBC / HPF 0-2  <3 RBC/hpf   Bacteria, UA FEW (*) RARE  CBC     Status: Abnormal   Collection Time    05/18/13  5:05 AM      Result Value Range   WBC 15.7 (*) 4.0 - 10.5 K/uL   RBC 4.54  3.87 - 5.11 MIL/uL   Hemoglobin 13.3  12.0 - 15.0 g/dL   HCT 16.1  09.6 - 04.5 %   MCV 83.3  78.0 - 100.0 fL   MCH 29.3  26.0 - 34.0 pg   MCHC 35.2  30.0 - 36.0 g/dL   RDW 40.9  81.1 - 91.4 %   Platelets 263  150 - 400 K/uL  COMPREHENSIVE METABOLIC PANEL     Status: Abnormal   Collection Time    05/18/13  5:05 AM      Result Value Range   Sodium 134 (*) 135 - 145 mEq/L   Potassium 3.6  3.5 - 5.1 mEq/L   Chloride 100  96 - 112 mEq/L   CO2 22  19 - 32 mEq/L   Glucose, Bld 130 (*) 70 - 99 mg/dL   BUN 9  6 - 23 mg/dL   Creatinine, Ser 7.82  0.50 - 1.10 mg/dL   Calcium 9.6  8.4 - 95.6 mg/dL   Total Protein 7.2  6.0 - 8.3 g/dL   Albumin 3.7  3.5 - 5.2 g/dL   AST 12  0 - 37 U/L   ALT 20  0 - 35 U/L   Alkaline Phosphatase 59  39 - 117 U/L   Total  Bilirubin 0.3  0.3 - 1.2 mg/dL   GFR calc non Af Amer >90  >90 mL/min   GFR calc Af Amer >90  >90 mL/min  LIPASE, BLOOD     Status: None   Collection Time    05/18/13  5:05 AM      Result Value Range   Lipase 34  11 - 59 U/L  AMYLASE     Status: None   Collection Time    05/18/13  5:05 AM      Result Value Range   Amylase 82  0 - 105 U/L    IMAGING US Ob Comp Less 14 Wks  05/18/2013   *RADIOLOGY REPORT*  Clinical Data: Viability. Estimated gestational age by LMP is 9 weeks 1 day.  OBSTETRIC <14 WK ULTRASOUND  Technique:  Transabdominal ultrasound was performed for evaluation of the gestation as well as the maternal uterus and adnexal regions.  Comparison:  04/16/2013  Intrauterine gestational sac: A single intrauterine gestational sac is visualized. Yolk sac: The yolk sac is present. Embryo: Fetal pole is identified. Cardiac Activity: Fetal cardiac activity is identified. Heart Rate: 174 bpm  CRL:  24 mm  9 w  1 d         [place] EDC: 12/20/2013  Maternal uterus/Adnexae: No visualized myometrial masses.  No evidence of subchorionic hemorrhage.  Right ovary is visualized, measuring 3.3 x 2.7 x 2.8 cm.  Small cyst is demonstrated.  No abnormal adnexal masses.  Left ovary is not visualized.  No free pelvic fluid collections.  IMPRESSION: Single living intrauterine pregnancy.  Estimated gestational age by crown-rump length is 9 weeks 1 day.  This represents appropriate growth since the previous study.   Original Report Authenticated By: Burman Nieves, M.D.   US Abdomen Limited Ruq  05/18/2013   *RADIOLOGY REPORT*  Clinical Data:  .  Right upper quadrant and generalized abdominal pain.  LIMITED ABDOMINAL ULTRASOUND - RIGHT UPPER QUADRANT  Comparison:  None.  Findings:  Gallbladder:  Gallbladder is normal.  No stones, sludge, wall thickening, or edema.  Murphy's  sign is indeterminate because the patient is diffusely tender to the touch.  Common bile duct:  Normal caliber.  Diameter is measured at 2.1 mm.   Liver:  Normal homogeneous parenchymal echotexture.  No focal lesions identified.  IMPRESSION: No evidence of cholelithiasis or cholecystitis.                    Original Report Authenticated By: Burman Nieves, M.D.   Consult Dr Ellyn Hack LR 1000 ml, Dilaudid 1 mg IV, Phenergan 25 mg IV MRI abdomen Mr Abdomen Wo Contrast  05/18/2013   CLINICAL DATA:  Diffuse abdominal pain. Nausea and vomiting. First trimester pregnancy.  EXAM: MRI ABDOMEN WITHOUT CONTRAST  TECHNIQUE: Multiplanar, multisequence MR imaging was performed. No intravenous contrast was administered.  COMPARISON:  05/18/2013 obstetric ultrasound.  FINDINGS: Abnormal free fluid in the right inferior paracolic gutter adjacent to the appendix which measures up to 1.1 cm in diameter. There is potentially some luminal fluid in the distal appendix. Appearance favors acute appendicitis.  Right ovarian cystic lesion likely represents corpus luteum of pregnancy and is minimally complex the the today's exam was not performed for specific fetal evaluation but the presence of the fetus is noted.  Right greater than left extrarenal pelvis without hydronephrosis or significant hydroureter.  IMPRESSION: 1. Acute appendicitis. Appendiceal diameter up to 1.1 cm with adjacent free fluid in the right paracolic gutter. 2. Suspected right-sided corpus luteum of pregnancy.  These results will be called to the ordering clinician or representative by the Radiologist Assistant, and communication documented in the PACS Dashboard.   Electronically Signed   By: Herbie Baltimore   On: 05/18/2013 09:24   US Ob Comp Less 14 Wks  05/18/2013   *RADIOLOGY REPORT*  Clinical Data: Viability. Estimated gestational age by LMP is 9 weeks 1 day.  OBSTETRIC <14 WK ULTRASOUND  Technique:  Transabdominal ultrasound was performed for evaluation of the gestation as well as the maternal uterus and adnexal regions.  Comparison:  04/16/2013  Intrauterine gestational sac: A single intrauterine  gestational sac is visualized. Yolk sac: The yolk sac is present. Embryo: Fetal pole is identified. Cardiac Activity: Fetal cardiac activity is identified. Heart Rate: 174 bpm  CRL:  24 mm  9 w  1 d         [place] EDC: 12/20/2013  Maternal uterus/Adnexae: No visualized myometrial masses.  No evidence of subchorionic hemorrhage.  Right ovary is visualized, measuring 3.3 x 2.7 x 2.8 cm.  Small cyst is demonstrated.  No abnormal adnexal masses.  Left ovary is not visualized.  No free pelvic fluid collections.  IMPRESSION: Single living intrauterine pregnancy.  Estimated gestational age by crown-rump length is 9 weeks 1 day.  This represents appropriate growth since the previous study.   Original Report Authenticated By: Burman Nieves, M.D.   US Abdomen Limited Ruq  05/18/2013   *RADIOLOGY REPORT*  Clinical Data:  .  Right upper quadrant and generalized abdominal pain.  LIMITED ABDOMINAL ULTRASOUND - RIGHT UPPER QUADRANT  Comparison:  None.  Findings:  Gallbladder:  Gallbladder is normal.  No stones, sludge, wall thickening, or edema.  Murphy's sign is indeterminate because the patient is diffusely tender to the touch.  Common bile duct:  Normal caliber.  Diameter is measured at 2.1 mm.  Liver:  Normal homogeneous parenchymal echotexture.  No focal lesions identified.  IMPRESSION: No evidence of cholelithiasis or cholecystitis.  Original Report Authenticated By: Burman Nieves, M.D.  Dr. Ellyn Hack aware of acute appendicitis- surgeon to be contacted Hiram Comber [facility] Sharen Counter Certified Nurse-Midwife 05/18/2013  8:10 AM

## 2013-05-18 NOTE — ED Notes (Signed)
Pt sent from [facility] hospital for dx tests. Sent to ED with dx of "appendicitis" Surgeon at pt bedside

## 2013-05-18 NOTE — H&P (Signed)
[name] is an 22 y.o. female.   Chief Complaint: abd pain HPI: 22 yo WF with [redacted]w[redacted]d intrauterine pregnancy developed generalized upper abd pain and bl abd pain last night after eating [place] food. Pain generally worsened throughout night. Tried taking warm bathes without relief. Pain became focused in RLQ. Developed nausea and vomiting. No f/c. Reports similar pain in past with GI bug and miscarriage. No vag discharge/bleeding. No dysuria. No vision changes/HA. No hematuria. preg normal to date. Had prior miscarriage. 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 Seen at [facility] with labs and ua, u/s. Sent to [facility] for MRI. MRI c/w acute appendicitis.   Past Medical History  Diagnosis Date  . No pertinent past medical history     Past Surgical History  Procedure Laterality Date  . Cesarean section      Emergent  . Cesarean section      Family History  Problem Relation Age of Onset  . Other Neg Hx    Social History:  reports that she has never smoked. She has never used smokeless tobacco. She reports that she does not drink alcohol or use illicit drugs.  Allergies: No Known Allergies   (Not in a hospital admission)  Results for orders placed during the hospital encounter of 05/18/13 (from the past 48 hour(s))  URINALYSIS, ROUTINE W REFLEX MICROSCOPIC     Status: Abnormal   Collection Time    05/18/13  5:00 AM      Result Value Range   Color, Urine YELLOW  YELLOW   APPearance CLEAR  CLEAR   Specific Gravity, Urine 1.010  1.005 - 1.030   pH 7.5  5.0 - 8.0   Glucose, UA NEGATIVE  NEGATIVE mg/dL   Hgb urine dipstick TRACE (*) NEGATIVE   Bilirubin Urine NEGATIVE  NEGATIVE   Ketones, ur 15 (*) NEGATIVE mg/dL   Protein, ur NEGATIVE  NEGATIVE mg/dL   Urobilinogen, UA 0.2  0.0 - 1.0 mg/dL   Nitrite NEGATIVE  NEGATIVE   Leukocytes, UA TRACE (*) NEGATIVE  URINE MICROSCOPIC-ADD ON     Status: Abnormal   Collection Time    05/18/13  5:00 AM      Result Value Range   Squamous Epithelial / LPF RARE  RARE   WBC, UA 0-2  <3 WBC/hpf   RBC / HPF 0-2  <3 RBC/hpf   Bacteria, UA FEW (*) RARE  CBC     Status: Abnormal   Collection Time    05/18/13  5:05 AM      Result Value Range   WBC 15.7 (*) 4.0 - 10.5 K/uL   RBC 4.54  3.87 - 5.11 MIL/uL   Hemoglobin 13.3  12.0 - 15.0 g/dL   HCT 16.1  09.6 - 04.5 %   MCV 83.3  78.0 - 100.0 fL   MCH 29.3  26.0 - 34.0 pg   MCHC 35.2  30.0 - 36.0 g/dL   RDW 40.9  81.1 - 91.4 %   Platelets 263  150 - 400 K/uL  COMPREHENSIVE METABOLIC PANEL     Status: Abnormal   Collection Time    05/18/13  5:05 AM      Result Value Range   Sodium 134 (*) 135 - 145 mEq/L   Potassium 3.6  3.5 - 5.1 mEq/L   Chloride 100  96 - 112 mEq/L   CO2 22  19 - 32 mEq/L   Glucose, Bld 130 (*) 70 - 99 mg/dL   BUN 9  6 - 23 mg/dL   Creatinine, Ser 1.61  0.50 - 1.10 mg/dL   Calcium 9.6  8.4 - 09.6 mg/dL   Total Protein 7.2  6.0 - 8.3 g/dL   Albumin 3.7  3.5 - 5.2 g/dL   AST 12  0 - 37 U/L   ALT 20  0 - 35 U/L   Alkaline Phosphatase 59  39 - 117 U/L   Total Bilirubin 0.3  0.3 - 1.2 mg/dL   GFR calc non Af Amer >90  >90 mL/min   GFR calc Af Amer >90  >90 mL/min   Comment: (NOTE)     The eGFR has been calculated using the CKD EPI equation.     This calculation has not been validated in all clinical situations.     eGFR's persistently <90 mL/min signify possible Chronic Kidney     Disease.  LIPASE, BLOOD     Status: None   Collection Time    05/18/13  5:05 AM      Result Value Range   Lipase 34  11 - 59 U/L   Comment: Performed at [facility]  AMYLASE     Status: None   Collection Time    05/18/13  5:05 AM      Result Value Range   Amylase 82  0 - 105 U/L   Comment: Performed at [facility]   Mr Pelvis Wo Contrast  05/18/2013   : CLINICAL DATA: Diffuse abdominal pain. Nausea and vomiting. First trimester pregnancy.  EXAM: MRI PELVIS WITHOUT CONTRAST  TECHNIQUE: Multiplanar, multisequence MR imaging was performed. No  intravenous contrast was administered.  COMPARISON: 05/18/2013 obstetric ultrasound.  FINDINGS: Abnormal free fluid in the right inferior paracolic gutter adjacent to the appendix which measures up to 1.1 cm in diameter. There is potentially some luminal fluid in the distal appendix. Appearance favors acute appendicitis.  Right ovarian cystic lesion likely represents corpus luteum of pregnancy and is minimally complex.  Today's exam was not performed for specific fetal evaluation but the presence of the fetus is noted.  Right greater than left extrarenal pelvis without hydronephrosis or significant hydroureter.  IMPRESSION: 1. Acute appendicitis. Appendiceal diameter up to 1.1 cm with adjacent free fluid in the right paracolic gutter. 2. Suspected right-sided corpus luteum of pregnancy.  These results will be called to the ordering clinician or representative by the Radiologist Assistant, and communication documented in the PACS Dashboard.   Electronically Signed   By: Herbie Baltimore   On: 05/18/2013 09:37   Mr Abdomen Wo Contrast  05/18/2013   CLINICAL DATA:  Diffuse abdominal pain. Nausea and vomiting. First trimester pregnancy.  EXAM: MRI ABDOMEN WITHOUT CONTRAST  TECHNIQUE: Multiplanar, multisequence MR imaging was performed. No intravenous contrast was administered.  COMPARISON:  05/18/2013 obstetric ultrasound.  FINDINGS: Abnormal free fluid in the right inferior paracolic gutter adjacent to the appendix which measures up to 1.1 cm in diameter. There is potentially some luminal fluid in the distal appendix. Appearance favors acute appendicitis.  Right ovarian cystic lesion likely represents corpus luteum of pregnancy and is minimally complex the the today's exam was not performed for specific fetal evaluation but the presence of the fetus is noted.  Right greater than left extrarenal pelvis without hydronephrosis or significant hydroureter.  IMPRESSION: 1. Acute appendicitis. Appendiceal diameter up to 1.1  cm with adjacent free fluid in the right paracolic gutter. 2. Suspected right-sided corpus luteum of pregnancy.  These results will be called to the ordering  clinician or representative by the Radiologist Assistant, and communication documented in the PACS Dashboard.   Electronically Signed   By: Herbie Baltimore   On: 05/18/2013 09:24   US Ob Comp Less 14 Wks  05/18/2013   *RADIOLOGY REPORT*  Clinical Data: Viability. Estimated gestational age by LMP is 9 weeks 1 day.  OBSTETRIC <14 WK ULTRASOUND  Technique:  Transabdominal ultrasound was performed for evaluation of the gestation as well as the maternal uterus and adnexal regions.  Comparison:  04/16/2013  Intrauterine gestational sac: A single intrauterine gestational sac is visualized. Yolk sac: The yolk sac is present. Embryo: Fetal pole is identified. Cardiac Activity: Fetal cardiac activity is identified. Heart Rate: 174 bpm  CRL:  24 mm  9 w  1 d         [place] EDC: 12/20/2013  Maternal uterus/Adnexae: No visualized myometrial masses.  No evidence of subchorionic hemorrhage.  Right ovary is visualized, measuring 3.3 x 2.7 x 2.8 cm.  Small cyst is demonstrated.  No abnormal adnexal masses.  Left ovary is not visualized.  No free pelvic fluid collections.  IMPRESSION: Single living intrauterine pregnancy.  Estimated gestational age by crown-rump length is 9 weeks 1 day.  This represents appropriate growth since the previous study.   Original Report Authenticated By: Burman Nieves, M.D.   US Abdomen Limited Ruq  05/18/2013   *RADIOLOGY REPORT*  Clinical Data:  .  Right upper quadrant and generalized abdominal pain.  LIMITED ABDOMINAL ULTRASOUND - RIGHT UPPER QUADRANT  Comparison:  None.  Findings:  Gallbladder:  Gallbladder is normal.  No stones, sludge, wall thickening, or edema.  Murphy's sign is indeterminate because the patient is diffusely tender to the touch.  Common bile duct:  Normal caliber.  Diameter is measured at 2.1 mm.  Liver:  Normal homogeneous  parenchymal echotexture.  No focal lesions identified.  IMPRESSION: No evidence of cholelithiasis or cholecystitis.                    Original Report Authenticated By: Burman Nieves, M.D.    Review of Systems  Constitutional: Negative for fever, chills and weight loss.  HENT: Negative for hearing loss.   Eyes: Negative for double vision.  Respiratory: Negative for cough and shortness of breath.   Cardiovascular: Negative for chest pain, palpitations and leg swelling.  Gastrointestinal: Positive for nausea, vomiting and abdominal pain.  Genitourinary: Negative for dysuria, urgency and hematuria.  Musculoskeletal: Negative for myalgias.  Neurological: Positive for weakness. Negative for dizziness, focal weakness, seizures, loss of consciousness and headaches.  Psychiatric/Behavioral: Negative for substance abuse.    Blood pressure 111/60, pulse 83, temperature 98.4 F (36.9 C), temperature source Oral, resp. rate 16, last menstrual period 03/15/2013, SpO2 98.00%. Physical Exam  Vitals reviewed. Constitutional: She is oriented to person, place, and time. Vital signs are normal. She appears well-developed and well-nourished. She appears ill. No distress.  HENT:  Head: Normocephalic and atraumatic.  Right Ear: External ear normal.  Left Ear: External ear normal.  Eyes: Conjunctivae are normal. No scleral icterus.  Neck: Normal range of motion. Neck supple. No tracheal deviation present. No thyromegaly present.  Cardiovascular: Normal rate, regular rhythm, normal heart sounds and intact distal pulses.   Respiratory: Effort normal and breath sounds normal. No respiratory distress. She has no wheezes.  GI: Soft. She exhibits no distension. There is tenderness. There is guarding and tenderness at McBurney's point.    Obese; RLQ localized peritonitis  Musculoskeletal: Normal range of  motion. She exhibits no edema and no tenderness.  Lymphadenopathy:    She has no cervical adenopathy.    Neurological: She is alert and oriented to person, place, and time.  Skin: Skin is warm and dry. No rash noted. She is not diaphoretic. No erythema.  Psychiatric: She has a normal mood and affect. Her behavior is normal. Judgment and thought content normal.     Assessment/Plan 1)Acute appendicitis 2)Intrauterine pregnancy - 9 weeks 3)Leukocytosis due to #1  We discussed the etiology and management of acute appendicitis. We discussed operative and nonoperative management. I do not believe non-operative management is a viable option given her RLQ peritonitis even though is in her 1st trimester.   I recommended operative management along with IV antibiotics. The discussion was held with the pt and her mom via phone.  We discussed laparoscopic appendectomy. We discussed the risk and benefits of surgery including but not limited to bleeding, infection, injury to surrounding structures, need to convert to an open procedure, blood clot formation, post operative abscess or wound infection, staple line complications such as leak or bleeding, hernia formation, post operative ileus, need for additional procedures, anesthesia complications, and the typical postoperative course. I explained that the patient should expect a good improvement in their symptoms.  I did discuss the risk of fetal loss with surgery. I also discussed the risk of fetal loss with non-operative management of her appendicitis. I believe the 'safest' course of action is to undergo appendectomy.  Npo, ivf, iv abx, scd To OR for lap appy All questions asked and answered Pt has elected to proceed with surgery  Mary Sella. Andrey Campanile, MD, FACS General, Bariatric, & Minimally Invasive Surgery [facility] Surgery, [place]    [facility] M 05/18/2013, 10:45 AM

## 2013-05-18 NOTE — Anesthesia Postprocedure Evaluation (Signed)
  Anesthesia Post-op Note  Patient: [name]  Procedure(s) Performed: Procedure(s): APPENDECTOMY LAPAROSCOPIC (N/A)  Patient Location: PACU  Anesthesia Type:General  Level of Consciousness: awake, alert  and oriented  Airway and Oxygen Therapy: Patient Spontanous Breathing and Patient connected to nasal cannula oxygen  Post-op Pain: mild  Post-op Assessment: Post-op Vital signs reviewed, Patient's Cardiovascular Status Stable, Respiratory Function Stable, Patent Airway and Pain level controlled  Post-op Vital Signs: stable  Complications: No apparent anesthesia complications

## 2013-05-18 NOTE — Progress Notes (Signed)
Dr. Ellyn Hack notified of MRI finding of Appendicitis. Dr. Andrey Campanile (genral surgeon) contacted and accepting pt. Pt to remain at [facility] for further treatment .

## 2013-05-18 NOTE — MAU Note (Signed)
Pt transferred to [facility] for MRI.

## 2013-05-19 ENCOUNTER — Encounter (HOSPITAL_COMMUNITY): Payer: Self-pay

## 2013-05-19 DIAGNOSIS — K3580 Unspecified acute appendicitis: Secondary | ICD-10-CM | POA: Diagnosis present

## 2013-05-19 MED ORDER — OXYCODONE-ACETAMINOPHEN 5-325 MG PO TABS: 1.0000 | ORAL_TABLET | ORAL | Status: DC | PRN

## 2013-05-19 NOTE — Progress Notes (Addendum)
Pt c/o burning at IV site. IV d/c'd. MD contacted at approximately 2130. Verbal order to leave IV out. Pt may possibly discharge in morning.

## 2013-05-19 NOTE — Progress Notes (Signed)
1 Day Post-Op   Assessment: s/p Procedure(s): APPENDECTOMY LAPAROSCOPIC Patient Active Problem List   Diagnosis Date Noted  . Appendicitis, acute 05/19/2013    Doing well post lap appy  Plan: Discharge  Subjective: Feels OK and able to go home, minimal pain, tolerating diet  Objective: Vital signs in last 24 hours: Temp:  [98 F (36.7 C)-98.7 F (37.1 C)] 98.3 F (36.8 C) (09/07 0618) Pulse Rate:  [69-88] 72 (09/07 0618) Resp:  [15-18] 18 (09/07 0618) BP: (95-115)/(56-68) 98/66 mmHg (09/07 0618) SpO2:  [95 %-100 %] 97 % (09/07 0618)   Intake/Output from previous day: 09/06 0701 - 09/07 0700 In: 2300 [I.V.:2300] Out: 300 [Urine:250; Blood:50]  General appearance: alert, cooperative and no distress Resp: clear to auscultation bilaterally GI: Minimal distention, soft, mildy tender c/w surgery, no peritoneal signs, BS+  Incision: healing well  Lab Results:   Recent Labs  05/18/13 0505  WBC 15.7*  HGB 13.3  HCT 37.8  PLT 263   BMET  Recent Labs  05/18/13 0505  NA 134*  K 3.6  CL 100  CO2 22  GLUCOSE 130*  BUN 9  CREATININE 0.58  CALCIUM 9.6    MEDS, Scheduled . heparin subcutaneous  5,000 Units Subcutaneous Q8H  . prenatal multivitamin  1 tablet Oral Q1200    Studies/Results: Mr Pelvis Wo Contrast  05/18/2013   : CLINICAL DATA: Diffuse abdominal pain. Nausea and vomiting. First trimester pregnancy.  EXAM: MRI PELVIS WITHOUT CONTRAST  TECHNIQUE: Multiplanar, multisequence MR imaging was performed. No intravenous contrast was administered.  COMPARISON: 05/18/2013 obstetric ultrasound.  FINDINGS: Abnormal free fluid in the right inferior paracolic gutter adjacent to the appendix which measures up to 1.1 cm in diameter. There is potentially some luminal fluid in the distal appendix. Appearance favors acute appendicitis.  Right ovarian cystic lesion likely represents corpus luteum of pregnancy and is minimally complex.  Today's exam was not performed for  specific fetal evaluation but the presence of the fetus is noted.  Right greater than left extrarenal pelvis without hydronephrosis or significant hydroureter.  IMPRESSION: 1. Acute appendicitis. Appendiceal diameter up to 1.1 cm with adjacent free fluid in the right paracolic gutter. 2. Suspected right-sided corpus luteum of pregnancy.  These results will be called to the ordering clinician or representative by the Radiologist Assistant, and communication documented in the PACS Dashboard.   Electronically Signed   By: Herbie Baltimore   On: 05/18/2013 09:37   Mr Abdomen Wo Contrast  05/18/2013   CLINICAL DATA:  Diffuse abdominal pain. Nausea and vomiting. First trimester pregnancy.  EXAM: MRI ABDOMEN WITHOUT CONTRAST  TECHNIQUE: Multiplanar, multisequence MR imaging was performed. No intravenous contrast was administered.  COMPARISON:  05/18/2013 obstetric ultrasound.  FINDINGS: Abnormal free fluid in the right inferior paracolic gutter adjacent to the appendix which measures up to 1.1 cm in diameter. There is potentially some luminal fluid in the distal appendix. Appearance favors acute appendicitis.  Right ovarian cystic lesion likely represents corpus luteum of pregnancy and is minimally complex the the today's exam was not performed for specific fetal evaluation but the presence of the fetus is noted.  Right greater than left extrarenal pelvis without hydronephrosis or significant hydroureter.  IMPRESSION: 1. Acute appendicitis. Appendiceal diameter up to 1.1 cm with adjacent free fluid in the right paracolic gutter. 2. Suspected right-sided corpus luteum of pregnancy.  These results will be called to the ordering clinician or representative by the Radiologist Assistant, and communication documented in the PACS Dashboard.  Electronically Signed   By: Herbie Baltimore   On: 05/18/2013 09:24   US Ob Comp Less 14 Wks  05/18/2013   *RADIOLOGY REPORT*  Clinical Data: Viability. Estimated gestational age by LMP  is 9 weeks 1 day.  OBSTETRIC <14 WK ULTRASOUND  Technique:  Transabdominal ultrasound was performed for evaluation of the gestation as well as the maternal uterus and adnexal regions.  Comparison:  04/16/2013  Intrauterine gestational sac: A single intrauterine gestational sac is visualized. Yolk sac: The yolk sac is present. Embryo: Fetal pole is identified. Cardiac Activity: Fetal cardiac activity is identified. Heart Rate: 174 bpm  CRL:  24 mm  9 w  1 d         [place] EDC: 12/20/2013  Maternal uterus/Adnexae: No visualized myometrial masses.  No evidence of subchorionic hemorrhage.  Right ovary is visualized, measuring 3.3 x 2.7 x 2.8 cm.  Small cyst is demonstrated.  No abnormal adnexal masses.  Left ovary is not visualized.  No free pelvic fluid collections.  IMPRESSION: Single living intrauterine pregnancy.  Estimated gestational age by crown-rump length is 9 weeks 1 day.  This represents appropriate growth since the previous study.   Original Report Authenticated By: Burman Nieves, M.D.   US Abdomen Limited Ruq  05/18/2013   *RADIOLOGY REPORT*  Clinical Data:  .  Right upper quadrant and generalized abdominal pain.  LIMITED ABDOMINAL ULTRASOUND - RIGHT UPPER QUADRANT  Comparison:  None.  Findings:  Gallbladder:  Gallbladder is normal.  No stones, sludge, wall thickening, or edema.  Murphy's sign is indeterminate because the patient is diffusely tender to the touch.  Common bile duct:  Normal caliber.  Diameter is measured at 2.1 mm.  Liver:  Normal homogeneous parenchymal echotexture.  No focal lesions identified.  IMPRESSION: No evidence of cholelithiasis or cholecystitis.                    Original Report Authenticated By: Burman Nieves, M.D.      LOS: 1 day     Currie Paris, MD, [facility] Surgery, [place] [phone]   05/19/2013 8:45 AM

## 2013-05-19 NOTE — Progress Notes (Signed)
Discharge instructions gone over with patient. Home medications gone over. Follow up appointment is to be made. Prescription given. Incisional care, diet, activity, and signs and symptoms of infection discussed. Patient verbalized understanding of when to call the doctor and discharge instructions. My chart discussed.

## 2013-05-20 ENCOUNTER — Encounter (HOSPITAL_COMMUNITY): Payer: Self-pay | Admitting: General Surgery

## 2013-05-20 ENCOUNTER — Telehealth (INDEPENDENT_AMBULATORY_CARE_PROVIDER_SITE_OTHER): Payer: Self-pay | Admitting: *Deleted

## 2013-05-20 NOTE — Telephone Encounter (Signed)
Called and left message for patient to update her on her PO appt.  Appt with [facility] on 9/16 215p.

## 2013-05-21 ENCOUNTER — Telehealth (INDEPENDENT_AMBULATORY_CARE_PROVIDER_SITE_OTHER): Payer: Self-pay

## 2013-05-21 ENCOUNTER — Other Ambulatory Visit (INDEPENDENT_AMBULATORY_CARE_PROVIDER_SITE_OTHER): Payer: Self-pay

## 2013-05-21 DIAGNOSIS — R109 Unspecified abdominal pain: Secondary | ICD-10-CM

## 2013-05-21 MED ORDER — HYDROCODONE-ACETAMINOPHEN 5-325 MG PO TABS: 1.0000 | ORAL_TABLET | Freq: Four times a day (QID) | ORAL | Status: DC | PRN

## 2013-05-21 NOTE — Telephone Encounter (Signed)
Patient states she is pain asking for medication ; Hydrocodone 5/325mg  #30 called to [facility] [place] [phone]  Per standing orders

## 2013-05-23 ENCOUNTER — Telehealth (INDEPENDENT_AMBULATORY_CARE_PROVIDER_SITE_OTHER): Payer: Self-pay | Admitting: General Surgery

## 2013-05-23 NOTE — Telephone Encounter (Signed)
LMOM 9/11@5 :15 asking patient to return my call

## 2013-05-23 NOTE — Telephone Encounter (Signed)
Message copied by June Leap on Thu May 23, 2013  5:16 PM ------      Message from: Andrey Campanile, ERIC M      Created: Wed May 22, 2013 11:48 AM       pls call pt and make sure she has got an ob/gyn appt this week or next to follow up on her pregnancy            Thanks      wilson ------

## 2013-05-24 NOTE — Telephone Encounter (Signed)
Spoke with patient and she stated that she saw Dr.Bovard on 05/23/13 and the baby was fine..she will keep her 9/16 appt with the [facility]

## 2013-05-25 ENCOUNTER — Encounter (HOSPITAL_COMMUNITY): Payer: Self-pay | Admitting: *Deleted

## 2013-05-25 ENCOUNTER — Inpatient Hospital Stay (HOSPITAL_COMMUNITY)
Admission: AD | Admit: 2013-05-25 | Discharge: 2013-05-25 | Disposition: A | Discharge status: Home / Self Care | Payer: Medicaid Other | Source: Ambulatory Visit | Attending: Obstetrics and Gynecology | Admitting: Obstetrics and Gynecology

## 2013-05-25 DIAGNOSIS — O2390 Unspecified genitourinary tract infection in pregnancy, unspecified trimester: Secondary | ICD-10-CM | POA: Insufficient documentation

## 2013-05-25 DIAGNOSIS — N949 Unspecified condition associated with female genital organs and menstrual cycle: Secondary | ICD-10-CM | POA: Insufficient documentation

## 2013-05-25 DIAGNOSIS — N760 Acute vaginitis: Secondary | ICD-10-CM | POA: Insufficient documentation

## 2013-05-25 LAB — URINALYSIS, ROUTINE W REFLEX MICROSCOPIC
Bilirubin Urine: NEGATIVE
Ketones, ur: NEGATIVE mg/dL
Specific Gravity, Urine: 1.025 (ref 1.005–1.030)

## 2013-05-25 LAB — WET PREP, GENITAL
Clue Cells Wet Prep HPF POC: NONE SEEN
Trich, Wet Prep: NONE SEEN
Yeast Wet Prep HPF POC: NONE SEEN

## 2013-05-25 LAB — URINE MICROSCOPIC-ADD ON

## 2013-05-25 MED ORDER — FLUCONAZOLE 150 MG PO TABS
150.0000 mg | ORAL_TABLET | Freq: Once | ORAL | Status: AC
  Administered 2013-05-25: 150 mg via ORAL
  Filled 2013-05-25: qty 1

## 2013-05-25 NOTE — MAU Provider Note (Signed)
History     CSN: [id number]  Arrival date and time: 05/25/13 2153   First Provider Initiated Contact with Patient 05/25/13 2228      Chief Complaint  Patient presents with  . Vaginitis   HPI  Pt is a [id number] at [redacted]w[redacted]d weeks with report of vaginal burning that started yesterday.  Reports in shower last night and had intense burning when wiping vaginal area.  Seems like skin was removed on wash cloth.  No history of exposure to herpes.  Denies vaginal itching or abnormal vaginal discharge.    Past Medical History  Diagnosis Date  . No pertinent past medical history     Past Surgical History  Procedure Laterality Date  . Cesarean section      Emergent  . Cesarean section    . Laparoscopic appendectomy N/A 05/18/2013    Procedure: APPENDECTOMY LAPAROSCOPIC;  Surgeon: Atilano Ina, MD;  Location: [facility] OR;  Service: General;  Laterality: N/A;    Family History  Problem Relation Age of Onset  . Other Neg Hx     History  Substance Use Topics  . Smoking status: Never Smoker   . Smokeless tobacco: Never Used  . Alcohol Use: No    Allergies: No Known Allergies  Prescriptions prior to admission  Medication Sig Dispense Refill  . acetaminophen (TYLENOL) 325 MG tablet Take 650 mg by mouth every 6 (six) hours as needed for pain.      . Prenatal Vit-Fe Fumarate-FA (PRENATAL MULTIVITAMIN) TABS tablet Take 1 tablet by mouth daily at 12 noon.      Marland Kitchen HYDROcodone-acetaminophen (NORCO) 5-325 MG per tablet Take 1 tablet by mouth every 6 (six) hours as needed for pain.  30 tablet  0  . oxyCODONE-acetaminophen (PERCOCET/ROXICET) 5-325 MG per tablet Take 1 tablet by mouth every 4 (four) hours as needed.  10 tablet  0    Review of Systems  Genitourinary:       Vaginal burning  All other systems reviewed and are negative.   Physical Exam   Blood pressure 110/63, pulse 99, temperature 98.5 F (36.9 C), resp. rate 18, height 5\' 3"  (1.6 m), weight 76.658 kg (169 lb), last menstrual period  03/15/2013, SpO2 98.00%.  Physical Exam  Constitutional: She is oriented to person, place, and time. She appears well-developed and well-nourished. No distress.  HENT:  Head: Normocephalic.  Neck: Normal range of motion. Neck supple.  Cardiovascular: Normal rate, regular rhythm and normal heart sounds.  Exam reveals no gallop and no friction rub.   No murmur heard. Respiratory: Effort normal and breath sounds normal. No respiratory distress.  GI: Soft. There is no tenderness. There is no CVA tenderness.  Genitourinary: Uterus is enlarged. Cervix exhibits no motion tenderness and no discharge. There is erythema around the vagina. No bleeding around the vagina. No vaginal discharge found.  No abnormal lesions seen on vagina.   Musculoskeletal: Normal range of motion.  Neurological: She is alert and oriented to person, place, and time.  Skin: Skin is warm and dry.  Psychiatric: She has a normal mood and affect.    MAU Course  Procedures Results for orders placed during the hospital encounter of 05/25/13 (from the past 24 hour(s))  URINALYSIS, ROUTINE W REFLEX MICROSCOPIC     Status: Abnormal   Collection Time    05/25/13 10:01 PM      Result Value Range   Color, Urine YELLOW  YELLOW   APPearance CLOUDY (*) CLEAR   Specific Gravity,  Urine 1.025  1.005 - 1.030   pH 6.0  5.0 - 8.0   Glucose, UA NEGATIVE  NEGATIVE mg/dL   Hgb urine dipstick SMALL (*) NEGATIVE   Bilirubin Urine NEGATIVE  NEGATIVE   Ketones, ur NEGATIVE  NEGATIVE mg/dL   Protein, ur NEGATIVE  NEGATIVE mg/dL   Urobilinogen, UA 0.2  0.0 - 1.0 mg/dL   Nitrite NEGATIVE  NEGATIVE   Leukocytes, UA TRACE (*) NEGATIVE  URINE MICROSCOPIC-ADD ON     Status: Abnormal   Collection Time    05/25/13 10:01 PM      Result Value Range   Squamous Epithelial / LPF MANY (*) RARE   WBC, UA 0-2  <3 WBC/hpf   RBC / HPF 7-10  <3 RBC/hpf   Bacteria, UA MANY (*) RARE   Crystals CA OXALATE CRYSTALS (*) NEGATIVE  WET PREP, GENITAL      Status: Abnormal   Collection Time    05/25/13 10:36 PM      Result Value Range   Yeast Wet Prep HPF POC NONE SEEN  NONE SEEN   Trich, Wet Prep NONE SEEN  NONE SEEN   Clue Cells Wet Prep HPF POC NONE SEEN  NONE SEEN   WBC, Wet Prep HPF POC FEW (*) NONE SEEN     Assessment and Plan  Vaginitis  Plan: Discharge to home Diflucan 150 mg PO Use OTC Monistat externally Follow-up if no improvement or worsening of symptoms.  [facility] 05/25/2013, 10:30 PM

## 2013-05-25 NOTE — MAU Note (Signed)
Patient complains of vaginal irritation. States her vagina is raw with "no skin"

## 2013-05-28 ENCOUNTER — Encounter (INDEPENDENT_AMBULATORY_CARE_PROVIDER_SITE_OTHER): Payer: Self-pay

## 2013-06-03 NOTE — Discharge Summary (Signed)
Physician Discharge Summary  [name] UEA:[id number] DOB: 08/03/1991 DOA: 05/18/2013  PCP: No primary provider on file.  Admit date: 05/18/2013 Discharge date: 05/19/13  Recommendations for Outpatient Follow-up:  1. Your Ob/Gyn within 1 week to check status of your pregnancy  Follow-up Information   Follow up with [facility]. Schedule an appointment as soon as possible for a visit in 2 weeks.   Contact information:   [address]   [place] [address] [phone]      Discharge Diagnoses:  1. Acute Appendicitis 2. Intrauterine pregnancy - 9 weeks  Surgical Procedure: Laparoscopic appendectomy  Discharge Condition: good Disposition: home  Diet recommendation: regular  Filed Weights   05/19/13 0800  Weight: 167 lb 4 oz (75.864 kg)    History of present illness: 22 yo WF with [redacted]w[redacted]d intrauterine pregnancy developed generalized upper abd pain and bl abd pain last night after eating [place] food. Pain generally worsened throughout night. Tried taking warm bathes without relief. Pain became focused in RLQ. Developed nausea and vomiting. No f/c. Reports similar pain in past with GI bug and miscarriage. No vag discharge/bleeding. No dysuria. No vision changes/HA. No hematuria. preg normal to date. Had prior miscarriage. 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 Seen at [facility] with labs and ua, u/s. Sent to [facility] for MRI. MRI c/w acute appendicitis.   Hospital Course: The patient was seen and evaluated. I discussed the case with on-call OB/GYN doctor - Dr Ellyn Hack. The patient was made n.p.o. And started on IV antibiotics. She was urgently taken to the operating room where she underwent laparoscopic appendectomy. Her postoperative course was unremarkable. On postoperative day 1 she was tolerating a diet. Her pain was well-controlled. Her vital signs are stable. She was tingling without difficulty. She was deemed stable for discharge. We have discussed  with her and her mother about followup with her OB/GYN the coming week to evaluate the status of her pregnancy. We also discussed discharge instructions as well   Discharge Instructions  Discharge Orders   Future Orders Complete By Expires   Diet - low sodium heart healthy  As directed    Discharge instructions  As directed    Comments:     [facility] ______[facility] LAPAROSCOPIC SURGERY: POST OP INSTRUCTIONS Always review your discharge instruction sheet given to you by the facility where your surgery was performed. IF YOU HAVE DISABILITY OR FAMILY LEAVE FORMS, YOU MUST BRING THEM TO THE OFFICE FOR PROCESSING.   DO NOT GIVE THEM TO YOUR DOCTOR.  A prescription for pain medication may be given to you upon discharge.  Take your pain medication as prescribed, if needed.  If narcotic pain medicine is not needed, then you may take acetaminophen (Tylenol) or ibuprofen (Advil) as needed. Take your usually prescribed medications unless otherwise directed. If you need a refill on your pain medication, please contact your pharmacy.  They will contact our office to request authorization. Prescriptions will not be filled after 5pm or on week-ends. You should follow a light diet the first few days after arrival home, such as soup and crackers, etc.  Be sure to include lots of fluids daily. Most patients will experience some swelling and bruising in the area of the incisions.  Ice packs will help.  Swelling and bruising can take several days to resolve.  It is common to experience some constipation if taking pain medication after surgery.  Increasing fluid intake and taking  a stool softener (such as Colace) will usually help or prevent this problem from occurring.  A mild laxative (Milk of Magnesia or Miralax) should be taken according to package instructions if there are no bowel movements after 48 hours. Unless discharge instructions indicate otherwise, you may remove your bandages 24-48  hours after surgery, and you may shower at that time.  You may have steri-strips (small skin tapes) in place directly over the incision.  These strips should be left on the skin for 7-10 days.  If your surgeon used skin glue on the incision, you may shower in 24 hours.  The glue will flake off over the next 2-3 weeks.  Any sutures or staples will be removed at the office during your follow-up visit. ACTIVITIES:  You may resume regular (light) daily activities beginning the next day-such as daily self-care, walking, climbing stairs-gradually increasing activities as tolerated.  You may have sexual intercourse when it is comfortable.  Refrain from any heavy lifting or straining until approved by your doctor. You may drive when you are no longer taking prescription pain medication, you can comfortably wear a seatbelt, and you can safely maneuver your car and apply brakes. RETURN TO WORK:  __________________________________________________________ [name] should see your doctor in the office for a follow-up appointment approximately 2-3 weeks after your surgery.  Make sure that you call for this appointment within a day or two after you arrive home to insure a convenient appointment time. OTHER INSTRUCTIONS: __________________________________________________________________________________________________________________________ __________________________________________________________________________________________________________________________ WHEN TO CALL YOUR DOCTOR: Fever over 101.0 Inability to urinate Continued bleeding from incision. Increased pain, redness, or drainage from the incision. Increasing abdominal pain  The clinic staff is available to answer your questions during regular business hours.  Please don't hesitate to call and ask to speak to one of the nurses for clinical concerns.  If you have a medical emergency, go to the nearest emergency room or call 911.  A surgeon from [facility] is always on call at the hospital. [address], [place], [place]  [address] ? [address] [address], [place], [place]   [address] [phone] ? [phone] ? FAX [phone] Web site: [email or web address]   Increase activity slowly  As directed    May shower / Bathe  As directed    Comments:     Starting tomorrow   No dressing needed  As directed        Medication List         acetaminophen 325 MG tablet  Commonly known as:  TYLENOL  Take 650 mg by mouth every 6 (six) hours as needed for pain.     prenatal multivitamin Tabs tablet  Take 1 tablet by mouth daily at 12 noon.           Follow-up Information   Follow up with [facility]. Schedule an appointment as soon as possible for a visit in 2 weeks.   Contact information:   [address]   [place] [address] [phone]        The results of significant diagnostics from this hospitalization (including imaging, microbiology, ancillary and laboratory) are listed below for reference.    Significant Diagnostic Studies: Mr Pelvis Wo Contrast  05/18/2013   : CLINICAL DATA: Diffuse abdominal pain. Nausea and vomiting. First trimester pregnancy.  EXAM: MRI PELVIS WITHOUT CONTRAST  TECHNIQUE: Multiplanar, multisequence MR imaging was performed. No intravenous contrast was administered.  COMPARISON: 05/18/2013 obstetric ultrasound.  FINDINGS: Abnormal free fluid in the right inferior paracolic gutter adjacent to the appendix which measures up to 1.1 cm in diameter. There is potentially some luminal fluid in the distal appendix. Appearance favors acute appendicitis.  Right ovarian cystic lesion likely represents corpus luteum of pregnancy and is minimally complex.  Today's exam was not performed for specific fetal evaluation but the presence of the fetus is noted.  Right greater than left extrarenal pelvis without hydronephrosis or significant hydroureter.  IMPRESSION:  1. Acute appendicitis. Appendiceal diameter up to 1.1 cm with adjacent free fluid in the right paracolic gutter. 2. Suspected right-sided corpus luteum of pregnancy.  These results will be called to the ordering clinician or representative by the Radiologist Assistant, and communication documented in the PACS Dashboard.   Electronically Signed   By: Herbie Baltimore   On: 05/18/2013 09:37   Mr Abdomen Wo Contrast  05/18/2013   CLINICAL DATA:  Diffuse abdominal pain. Nausea and vomiting. First trimester pregnancy.  EXAM: MRI ABDOMEN WITHOUT CONTRAST  TECHNIQUE: Multiplanar, multisequence MR imaging was performed. No intravenous contrast was administered.  COMPARISON:  05/18/2013 obstetric ultrasound.  FINDINGS: Abnormal free fluid in the right inferior paracolic gutter adjacent to the appendix which measures up to 1.1 cm in diameter. There is potentially some luminal fluid in the distal appendix. Appearance favors acute appendicitis.  Right ovarian cystic lesion likely represents corpus luteum of pregnancy and is minimally complex the the today's exam was not performed for specific fetal evaluation but the presence of the fetus is noted.  Right greater than left extrarenal pelvis without hydronephrosis or significant hydroureter.  IMPRESSION: 1. Acute appendicitis. Appendiceal diameter up to 1.1 cm with adjacent free fluid in the right paracolic gutter. 2. Suspected right-sided corpus luteum of pregnancy.  These results will be called to the ordering clinician or representative by the Radiologist Assistant, and communication documented in the PACS Dashboard.   Electronically Signed   By: Herbie Baltimore   On: 05/18/2013 09:24   US Ob Comp Less 14 Wks  05/18/2013   *RADIOLOGY REPORT*  Clinical Data: Viability. Estimated gestational age by LMP is 9 weeks 1 day.  OBSTETRIC <14 WK ULTRASOUND  Technique:  Transabdominal ultrasound was performed for evaluation of the gestation as well as the maternal uterus and  adnexal regions.  Comparison:  04/16/2013  Intrauterine gestational sac: A single intrauterine gestational sac is visualized. Yolk sac: The yolk sac is present. Embryo: Fetal pole is identified. Cardiac Activity: Fetal cardiac activity is identified. Heart Rate: 174 bpm  CRL:  24 mm  9 w  1 d         [place] EDC: 12/20/2013  Maternal uterus/Adnexae: No visualized myometrial masses.  No evidence of subchorionic hemorrhage.  Right ovary is visualized, measuring 3.3 x 2.7 x 2.8 cm.  Small cyst is demonstrated.  No abnormal adnexal masses.  Left ovary is not visualized.  No free pelvic fluid collections.  IMPRESSION: Single living intrauterine pregnancy.  Estimated gestational age by crown-rump length is 9 weeks 1 day.  This represents appropriate growth since the previous study.   Original Report Authenticated By: Burman Nieves, M.D.   US Abdomen Limited Ruq  05/18/2013   *RADIOLOGY REPORT*  Clinical Data:  .  Right upper quadrant and generalized abdominal pain.  LIMITED ABDOMINAL ULTRASOUND - RIGHT UPPER QUADRANT  Comparison:  None.  Findings:  Gallbladder:  Gallbladder is normal.  No stones, sludge, wall thickening, or edema.  Murphy's sign is indeterminate because the  patient is diffusely tender to the touch.  Common bile duct:  Normal caliber.  Diameter is measured at 2.1 mm.  Liver:  Normal homogeneous parenchymal echotexture.  No focal lesions identified.  IMPRESSION: No evidence of cholelithiasis or cholecystitis.                    Original Report Authenticated By: Burman Nieves, M.D.    Microbiology: Recent Results (from the past 240 hour(s))  WET PREP, GENITAL     Status: Abnormal   Collection Time    05/25/13 10:36 PM      Result Value Range Status   Yeast Wet Prep HPF POC NONE SEEN  NONE SEEN Final   Trich, Wet Prep NONE SEEN  NONE SEEN Final   Clue Cells Wet Prep HPF POC NONE SEEN  NONE SEEN Final   WBC, Wet Prep HPF POC FEW (*) NONE SEEN Final   Comment: FEW BACTERIA SEEN     Labs: See  Epic  Principal Problem:   Appendicitis, acute Intrauterine pregnancy  Time coordinating discharge: 10 min  Signed:  Atilano Ina, MD [facility], [place] [phone] 06/03/2013, 2:24 PM

## 2013-06-04 LAB — OB RESULTS CONSOLE HIV ANTIBODY (ROUTINE TESTING): HIV: NONREACTIVE

## 2013-06-04 LAB — OB RESULTS CONSOLE RPR: RPR: NONREACTIVE

## 2013-06-04 LAB — OB RESULTS CONSOLE RUBELLA ANTIBODY, IGM: Rubella: IMMUNE

## 2013-06-04 LAB — OB RESULTS CONSOLE ABO/RH: RH Type: POSITIVE

## 2013-06-04 LAB — OB RESULTS CONSOLE HEPATITIS B SURFACE ANTIGEN: Hepatitis B Surface Ag: NEGATIVE

## 2013-06-04 LAB — OB RESULTS CONSOLE ANTIBODY SCREEN: Antibody Screen: NEGATIVE

## 2013-12-06 ENCOUNTER — Encounter (HOSPITAL_COMMUNITY): Payer: Self-pay

## 2013-12-12 ENCOUNTER — Encounter (HOSPITAL_COMMUNITY): Payer: Self-pay

## 2013-12-12 ENCOUNTER — Encounter (HOSPITAL_COMMUNITY)
Admission: RE | Admit: 2013-12-12 | Discharge: 2013-12-12 | Disposition: A | Discharge status: Home / Self Care | Payer: Medicaid Other | Source: Ambulatory Visit | Attending: Obstetrics and Gynecology | Admitting: Obstetrics and Gynecology

## 2013-12-12 DIAGNOSIS — Z3490 Encounter for supervision of normal pregnancy, unspecified, unspecified trimester: Secondary | ICD-10-CM

## 2013-12-12 HISTORY — DX: Other specified health status: Z78.9

## 2013-12-12 LAB — TYPE AND SCREEN
ABO/RH(D): A POS
Antibody Screen: NEGATIVE

## 2013-12-12 LAB — ABO/RH: ABO/RH(D): A POS

## 2013-12-12 LAB — BASIC METABOLIC PANEL
BUN: 5 mg/dL — ABNORMAL LOW (ref 6–23)
CHLORIDE: 105 meq/L (ref 96–112)
CO2: 21 meq/L (ref 19–32)
Calcium: 8.7 mg/dL (ref 8.4–10.5)
Creatinine, Ser: 0.54 mg/dL (ref 0.50–1.10)
GFR calc Af Amer: >90 mL/min (ref >90)
GFR calc non Af Amer: >90 mL/min (ref >90)
GLUCOSE: 77 mg/dL (ref 70–99)
POTASSIUM: 4.1 meq/L (ref 3.7–5.3)
Sodium: 142 mEq/L (ref 137–147)

## 2013-12-12 LAB — CBC
HCT: 32.6 % — ABNORMAL LOW (ref 36.0–46.0)
HEMOGLOBIN: 10.7 g/dL — AB (ref 12.0–15.0)
MCH: 27.7 pg (ref 26.0–34.0)
MCHC: 32.8 g/dL (ref 30.0–36.0)
MCV: 84.5 fL (ref 78.0–100.0)
Platelets: 229 10*3/uL (ref 150–400)
RBC: 3.86 MIL/uL — AB (ref 3.87–5.11)
RDW: 14.3 % (ref 11.5–15.5)
WBC: 8.8 10*3/uL (ref 4.0–10.5)

## 2013-12-12 LAB — RPR: RPR Ser Ql: NONREACTIVE

## 2013-12-12 NOTE — Patient Instructions (Signed)
20 [name]  12/12/2013   Your procedure is scheduled on:  12/13/13  Enter through the Main Entrance of [facility] at 8 AM.  Pick up the phone at the desk and dial [phone].   Call this number if you have problems the morning of surgery: [phone]   Remember:   Do not eat food:After Midnight.  Do not drink clear liquids: After Midnight.  Take these medicines the morning of surgery with A SIP OF WATER: NA   Do not wear jewelry, make-up or nail polish.  Do not wear lotions, powders, or perfumes. You may wear deodorant.  Do not shave 48 hours prior to surgery.  Do not bring valuables to the hospital.  [facility] is not   responsible for any belongings or valuables brought to the hospital.  Contacts, dentures or bridgework may not be worn into surgery.  Leave suitcase in the car. After surgery it may be brought to your room.  For patients admitted to the hospital, checkout time is 11:00 AM the day of              discharge.   Patients discharged the day of surgery will not be allowed to drive             home.  Name and phone number of your driver: NA  Special Instructions:      Please read over the following fact sheets that you were given:   Surgical Site Infection Prevention

## 2013-12-12 NOTE — H&P (Signed)
[name] is a 23 y.o. female G2P0101 at 4939 wk for rLTCS.  Pt declines TOLAC.  D/w pt r/b/a of rLTCS, pt wishes to proceed.  +FM, no LOF, no VB, occ ctx; PNC relatively uncomplicated except for appendicitis and appendectomy at [redacted]wk gestation.  Pregnancy dated by LMP cw 1st trimester US.    Maternal Medical History:  Contractions: Frequency: irregular.    Fetal activity: Perceived fetal activity is normal.    Prenatal Complications - Diabetes: none.    OB History   Grav Para Term Preterm Abortions TAB SAB Ect Mult Living   3 1  1 1  1   1      Obstetric Comments   Baby with gastroschesis    G1 35wk 4#5 LTCS - [facility] G2 present No abn pap, h/o Chl  Past Medical History  Diagnosis Date  . No pertinent past medical history   . Medical history non-contributory   collarbone fx, h/o anxiety  Past Surgical History  Procedure Laterality Date  . Cesarean section      Emergent  . Cesarean section    . Laparoscopic appendectomy N/A 05/18/2013    Procedure: APPENDECTOMY LAPAROSCOPIC;  Surgeon: Atilano InaEric M Wilson, MD;  Location[facility] OR;  Service: General;  Laterality: N/A;   Family History: DM, pancreatic CA, gastroschisis in son Social History:  reports that she has never smoked. She has never used smokeless tobacco. She reports that she does not drink alcohol or use illicit drugs.married, SAHM Meds PNV All NKDA   Prenatal Transfer Tool  Maternal Diabetes: No Genetic Screening: Normal Maternal Ultrasounds/Referrals: Normal Fetal Ultrasounds or other Referrals:  None Maternal Substance Abuse:  No Significant Maternal Medications:  None Significant Maternal Lab Results:  Lab values include: Group B Strep negative Other Comments:  appy at 9 wk  Review of Systems  Constitutional: Negative.   HENT: Negative.   Eyes: Negative.   Respiratory: Negative.   Cardiovascular: Negative.   Gastrointestinal: Negative.   Genitourinary: Negative.   Musculoskeletal:  Negative.   Skin: Negative.   Neurological: Negative.   Psychiatric/Behavioral: Negative.       Last menstrual period 03/15/2013. Maternal Exam:  Abdomen: Surgical scars: low transverse.   Fundal height is appropriate for gestation.   Estimated fetal weight is 7-7.5#.   Fetal presentation: vertex  Introitus: Normal vulva. Normal vagina.  Pelvis: questionable for delivery.   Cervix: Cervix evaluated by digital exam.     Physical Exam  Constitutional: She is oriented to person, place, and time. She appears well-developed and well-nourished.  HENT:  Head: Normocephalic and atraumatic.  Cardiovascular: Normal rate and regular rhythm.   Respiratory: Effort normal and breath sounds normal. No respiratory distress. She has no wheezes.  GI: Soft. Bowel sounds are normal. She exhibits no distension. There is no tenderness.  Musculoskeletal: Normal range of motion.  Neurological: She is alert and oriented to person, place, and time.  Skin: Skin is warm and dry.  Psychiatric: She has a normal mood and affect. Her behavior is normal.    Prenatal labs: ABO, Rh: --/--/A POS, A POS (04/02 1230) Antibody: NEG (04/02 1230) Rubella: Immune (09/23 1105) RPR: NON REACTIVE (04/02 1230)  HBsAg: Negative (09/23 1105)  HIV: Non-reactive (09/23 1105)  GBS:   negative  Hgb 11.7/Pap ASCUS/Plt 258K/Ur Cx neg/GC neg/ Chl neg/CF neg/ HSV cx neg/First Tri and AFP WNL/ glucola 70  US nl NT Dating cw LMP Nl anat, ant plac, female   Assessment/Plan: 22yo  G2P0101 at 39 for rLTCS D/w pt r/b/a, wishes to proceed gbbs negative Ancef for prophylaxis   Stuckert, Maleta Pacha Bovard 12/12/2013, 8:46 PM

## 2013-12-13 ENCOUNTER — Encounter (HOSPITAL_COMMUNITY): Payer: Self-pay | Admitting: *Deleted

## 2013-12-13 ENCOUNTER — Inpatient Hospital Stay (HOSPITAL_COMMUNITY)
Admission: AD | Admit: 2013-12-13 | Discharge: 2013-12-15 | DRG: 766 | Disposition: A | Discharge status: Home / Self Care | Payer: Medicaid Other | Source: Ambulatory Visit | Attending: Obstetrics and Gynecology | Admitting: Obstetrics and Gynecology

## 2013-12-13 ENCOUNTER — Encounter (HOSPITAL_COMMUNITY)
Admission: AD | Disposition: A | Discharge status: Home / Self Care | Payer: Self-pay | Source: Ambulatory Visit | Attending: Obstetrics and Gynecology

## 2013-12-13 ENCOUNTER — Inpatient Hospital Stay (HOSPITAL_COMMUNITY): Payer: Medicaid Other | Admitting: Anesthesiology

## 2013-12-13 ENCOUNTER — Encounter (HOSPITAL_COMMUNITY): Payer: Medicaid Other | Admitting: Anesthesiology

## 2013-12-13 DIAGNOSIS — Z3490 Encounter for supervision of normal pregnancy, unspecified, unspecified trimester: Secondary | ICD-10-CM

## 2013-12-13 DIAGNOSIS — O34219 Maternal care for unspecified type scar from previous cesarean delivery: Principal | ICD-10-CM | POA: Diagnosis present

## 2013-12-13 DIAGNOSIS — Z98891 History of uterine scar from previous surgery: Secondary | ICD-10-CM

## 2013-12-13 HISTORY — DX: History of uterine scar from previous surgery: Z98.891

## 2013-12-13 SURGERY — Surgical Case
Anesthesia: Spinal

## 2013-12-13 MED ORDER — DIPHENHYDRAMINE HCL 25 MG PO CAPS: 25.0000 mg | ORAL_CAPSULE | ORAL | Status: DC | PRN

## 2013-12-13 MED ORDER — CEFAZOLIN SODIUM-DEXTROSE 2-3 GM-% IV SOLR
2.0000 g | INTRAVENOUS | Status: DC
  Administered 2013-12-13: 2 g via INTRAVENOUS

## 2013-12-13 MED ORDER — KETOROLAC TROMETHAMINE 30 MG/ML IJ SOLN: 30.0000 mg | Freq: Four times a day (QID) | INTRAMUSCULAR | Status: AC | PRN

## 2013-12-13 MED ORDER — LACTATED RINGERS IV SOLN
INTRAVENOUS | Status: DC
  Administered 2013-12-13: 21:00:00 via INTRAVENOUS

## 2013-12-13 MED ORDER — MENTHOL 3 MG MT LOZG: 1.0000 | LOZENGE | OROMUCOSAL | Status: DC | PRN

## 2013-12-13 MED ORDER — ONDANSETRON HCL 4 MG/2ML IJ SOLN: 4.0000 mg | Freq: Three times a day (TID) | INTRAMUSCULAR | Status: DC | PRN

## 2013-12-13 MED ORDER — KETOROLAC TROMETHAMINE 30 MG/ML IJ SOLN
INTRAMUSCULAR | Status: AC
  Filled 2013-12-13: qty 1

## 2013-12-13 MED ORDER — LACTATED RINGERS IV SOLN: INTRAVENOUS | Status: DC

## 2013-12-13 MED ORDER — NALBUPHINE HCL 10 MG/ML IJ SOLN: 5.0000 mg | INTRAMUSCULAR | Status: DC | PRN

## 2013-12-13 MED ORDER — ZOLPIDEM TARTRATE 5 MG PO TABS: 5.0000 mg | ORAL_TABLET | Freq: Every evening | ORAL | Status: DC | PRN

## 2013-12-13 MED ORDER — SIMETHICONE 80 MG PO CHEW
80.0000 mg | CHEWABLE_TABLET | ORAL | Status: DC
  Administered 2013-12-13 – 2013-12-14 (×2): 80 mg via ORAL
  Filled 2013-12-13 (×2): qty 1

## 2013-12-13 MED ORDER — PHENYLEPHRINE 8 MG IN D5W 100 ML (0.08MG/ML) PREMIX OPTIME
INJECTION | INTRAVENOUS | Status: DC | PRN
  Administered 2013-12-13: 45 ug/min via INTRAVENOUS

## 2013-12-13 MED ORDER — HYDROMORPHONE HCL PF 1 MG/ML IJ SOLN
0.5000 mg | Freq: Once | INTRAMUSCULAR | Status: AC
  Administered 2013-12-13: 0.5 mg via INTRAVENOUS
  Filled 2013-12-13: qty 1

## 2013-12-13 MED ORDER — MORPHINE SULFATE (PF) 0.5 MG/ML IJ SOLN
INTRAMUSCULAR | Status: DC | PRN
  Administered 2013-12-13: .15 ug via EPIDURAL

## 2013-12-13 MED ORDER — ONDANSETRON HCL 4 MG PO TABS: 4.0000 mg | ORAL_TABLET | ORAL | Status: DC | PRN

## 2013-12-13 MED ORDER — SODIUM CHLORIDE 0.9 % IJ SOLN: 3.0000 mL | INTRAMUSCULAR | Status: DC | PRN

## 2013-12-13 MED ORDER — MORPHINE SULFATE 0.5 MG/ML IJ SOLN
INTRAMUSCULAR | Status: AC
  Filled 2013-12-13: qty 10

## 2013-12-13 MED ORDER — DIPHENHYDRAMINE HCL 50 MG/ML IJ SOLN: 12.5000 mg | INTRAMUSCULAR | Status: DC | PRN

## 2013-12-13 MED ORDER — LACTATED RINGERS IV SOLN
Freq: Once | INTRAVENOUS | Status: AC
  Administered 2013-12-13: 08:00:00 via INTRAVENOUS

## 2013-12-13 MED ORDER — LACTATED RINGERS IV SOLN
INTRAVENOUS | Status: DC
  Administered 2013-12-13 (×3): via INTRAVENOUS

## 2013-12-13 MED ORDER — SIMETHICONE 80 MG PO CHEW
80.0000 mg | CHEWABLE_TABLET | Freq: Three times a day (TID) | ORAL | Status: DC
  Administered 2013-12-13 – 2013-12-15 (×4): 80 mg via ORAL
  Filled 2013-12-13 (×5): qty 1

## 2013-12-13 MED ORDER — OXYTOCIN 40 UNITS IN LACTATED RINGERS INFUSION - SIMPLE MED: 62.5000 mL/h | INTRAVENOUS | Status: AC

## 2013-12-13 MED ORDER — TETANUS-DIPHTH-ACELL PERTUSSIS 5-2.5-18.5 LF-MCG/0.5 IM SUSP
0.5000 mL | Freq: Once | INTRAMUSCULAR | Status: AC
  Administered 2013-12-14: 0.5 mL via INTRAMUSCULAR
  Filled 2013-12-13: qty 0.5

## 2013-12-13 MED ORDER — LANOLIN HYDROUS EX OINT: 1.0000 "application " | TOPICAL_OINTMENT | CUTANEOUS | Status: DC | PRN

## 2013-12-13 MED ORDER — SCOPOLAMINE 1 MG/3DAYS TD PT72
MEDICATED_PATCH | TRANSDERMAL | Status: DC
Start: 2013-12-13 — End: 2013-12-15
  Administered 2013-12-13: 1.5 mg via TRANSDERMAL
  Filled 2013-12-13: qty 1

## 2013-12-13 MED ORDER — WITCH HAZEL-GLYCERIN EX PADS
1.0000 | MEDICATED_PAD | CUTANEOUS | Status: DC | PRN
Start: 2013-12-13 — End: 2013-12-15

## 2013-12-13 MED ORDER — FENTANYL CITRATE 0.05 MG/ML IJ SOLN
INTRAMUSCULAR | Status: AC
  Filled 2013-12-13: qty 2

## 2013-12-13 MED ORDER — FENTANYL CITRATE 0.05 MG/ML IJ SOLN: 25.0000 ug | INTRAMUSCULAR | Status: DC | PRN

## 2013-12-13 MED ORDER — KETOROLAC TROMETHAMINE 30 MG/ML IJ SOLN
30.0000 mg | Freq: Once | INTRAMUSCULAR | Status: AC
  Administered 2013-12-13: 30 mg via INTRAMUSCULAR

## 2013-12-13 MED ORDER — OXYTOCIN 10 UNIT/ML IJ SOLN
40.0000 [IU] | INTRAVENOUS | Status: DC | PRN
  Administered 2013-12-13: 40 mL via INTRAVENOUS

## 2013-12-13 MED ORDER — ONDANSETRON HCL 4 MG/2ML IJ SOLN
INTRAMUSCULAR | Status: DC | PRN
  Administered 2013-12-13: 4 mg via INTRAVENOUS

## 2013-12-13 MED ORDER — NALOXONE HCL 0.4 MG/ML IJ SOLN: 0.4000 mg | INTRAMUSCULAR | Status: DC | PRN

## 2013-12-13 MED ORDER — DIBUCAINE 1 % RE OINT
1.0000 | TOPICAL_OINTMENT | RECTAL | Status: DC | PRN
Start: 2013-12-13 — End: 2013-12-15

## 2013-12-13 MED ORDER — MEPERIDINE HCL 25 MG/ML IJ SOLN: 6.2500 mg | INTRAMUSCULAR | Status: DC | PRN

## 2013-12-13 MED ORDER — ONDANSETRON HCL 4 MG/2ML IJ SOLN: 4.0000 mg | INTRAMUSCULAR | Status: DC | PRN

## 2013-12-13 MED ORDER — PRENATAL MULTIVITAMIN CH
1.0000 | ORAL_TABLET | Freq: Every day | ORAL | Status: DC
  Filled 2013-12-13: qty 1

## 2013-12-13 MED ORDER — NALOXONE HCL 1 MG/ML IJ SOLN
1.0000 ug/kg/h | INTRAVENOUS | Status: DC | PRN
  Filled 2013-12-13: qty 2

## 2013-12-13 MED ORDER — SCOPOLAMINE 1 MG/3DAYS TD PT72
1.0000 | MEDICATED_PATCH | Freq: Once | TRANSDERMAL | Status: DC
  Administered 2013-12-13: 1.5 mg via TRANSDERMAL

## 2013-12-13 MED ORDER — FENTANYL CITRATE 0.05 MG/ML IJ SOLN
INTRAMUSCULAR | Status: DC | PRN
  Administered 2013-12-13: 25 ug via INTRATHECAL

## 2013-12-13 MED ORDER — DIPHENHYDRAMINE HCL 25 MG PO CAPS
25.0000 mg | ORAL_CAPSULE | Freq: Four times a day (QID) | ORAL | Status: DC | PRN
  Administered 2013-12-14: 25 mg via ORAL
  Filled 2013-12-13: qty 1

## 2013-12-13 MED ORDER — SIMETHICONE 80 MG PO CHEW: 80.0000 mg | CHEWABLE_TABLET | ORAL | Status: DC | PRN

## 2013-12-13 MED ORDER — ONDANSETRON HCL 4 MG/2ML IJ SOLN
INTRAMUSCULAR | Status: AC
  Filled 2013-12-13: qty 2

## 2013-12-13 MED ORDER — IBUPROFEN 800 MG PO TABS
800.0000 mg | ORAL_TABLET | Freq: Three times a day (TID) | ORAL | Status: DC
  Administered 2013-12-13 – 2013-12-15 (×5): 800 mg via ORAL
  Filled 2013-12-13 (×5): qty 1

## 2013-12-13 MED ORDER — CEFAZOLIN SODIUM-DEXTROSE 2-3 GM-% IV SOLR
INTRAVENOUS | Status: AC
  Filled 2013-12-13: qty 50

## 2013-12-13 MED ORDER — DIPHENHYDRAMINE HCL 50 MG/ML IJ SOLN: 25.0000 mg | INTRAMUSCULAR | Status: DC | PRN

## 2013-12-13 MED ORDER — OXYTOCIN 10 UNIT/ML IJ SOLN
INTRAMUSCULAR | Status: AC
  Filled 2013-12-13: qty 4

## 2013-12-13 MED ORDER — SENNOSIDES-DOCUSATE SODIUM 8.6-50 MG PO TABS
2.0000 | ORAL_TABLET | ORAL | Status: DC
  Administered 2013-12-13 – 2013-12-14 (×2): 2 via ORAL
  Filled 2013-12-13 (×2): qty 2

## 2013-12-13 MED ORDER — PHENYLEPHRINE 8 MG IN D5W 100 ML (0.08MG/ML) PREMIX OPTIME
INJECTION | INTRAVENOUS | Status: AC
  Filled 2013-12-13: qty 100

## 2013-12-13 MED ORDER — METOCLOPRAMIDE HCL 5 MG/ML IJ SOLN: 10.0000 mg | Freq: Three times a day (TID) | INTRAMUSCULAR | Status: DC | PRN

## 2013-12-13 MED ORDER — OXYCODONE-ACETAMINOPHEN 5-325 MG PO TABS
1.0000 | ORAL_TABLET | ORAL | Status: DC | PRN
  Administered 2013-12-13 – 2013-12-15 (×8): 2 via ORAL
  Filled 2013-12-13 (×8): qty 2

## 2013-12-13 MED ORDER — LACTATED RINGERS IV SOLN
INTRAVENOUS | Status: DC | PRN
  Administered 2013-12-13 (×2): via INTRAVENOUS

## 2013-12-13 SURGICAL SUPPLY — 39 items
APL SKNCLS STERI-STRIP NONHPOA (GAUZE/BANDAGES/DRESSINGS) ×1
BENZOIN TINCTURE PRP APPL 2/3 (GAUZE/BANDAGES/DRESSINGS) ×2 IMPLANT
CLAMP CORD UMBIL (MISCELLANEOUS) IMPLANT
CLOSURE WOUND 1/2 X4 (GAUZE/BANDAGES/DRESSINGS) ×1
CLOTH BEACON ORANGE TIMEOUT ST (SAFETY) ×3 IMPLANT
CONTAINER PREFILL 10% NBF 15ML (MISCELLANEOUS) IMPLANT
DRAPE LG THREE QUARTER DISP (DRAPES) IMPLANT
DRESSING TELFA 8X3 (GAUZE/BANDAGES/DRESSINGS) ×2 IMPLANT
DRSG OPSITE POSTOP 4X10 (GAUZE/BANDAGES/DRESSINGS) ×3 IMPLANT
DURAPREP 26ML APPLICATOR (WOUND CARE) ×3 IMPLANT
ELECT REM PT RETURN 9FT ADLT (ELECTROSURGICAL) ×3
ELECTRODE REM PT RTRN 9FT ADLT (ELECTROSURGICAL) ×1 IMPLANT
EXTRACTOR VACUUM M CUP 4 TUBE (SUCTIONS) IMPLANT
EXTRACTOR VACUUM M CUP 4' TUBE (SUCTIONS)
GLOVE BIO SURGEON STRL SZ 6.5 (GLOVE) ×2 IMPLANT
GLOVE BIO SURGEONS STRL SZ 6.5 (GLOVE) ×1
GOWN STRL REUS W/TWL LRG LVL3 (GOWN DISPOSABLE) ×6 IMPLANT
KIT ABG SYR 3ML LUER SLIP (SYRINGE) IMPLANT
NDL HYPO 25X5/8 SAFETYGLIDE (NEEDLE) IMPLANT
NEEDLE HYPO 25X5/8 SAFETYGLIDE (NEEDLE) IMPLANT
NS IRRIG 1000ML POUR BTL (IV SOLUTION) ×3 IMPLANT
PACK C SECTION WH (CUSTOM PROCEDURE TRAY) ×3 IMPLANT
PAD OB MATERNITY 4.3X12.25 (PERSONAL CARE ITEMS) ×3 IMPLANT
RTRCTR C-SECT PINK 25CM LRG (MISCELLANEOUS) ×3 IMPLANT
STAPLER VISISTAT 35W (STAPLE) IMPLANT
STRIP CLOSURE SKIN 1/2X4 (GAUZE/BANDAGES/DRESSINGS) ×1 IMPLANT
SUT MNCRL 0 VIOLET CTX 36 (SUTURE) ×2 IMPLANT
SUT MONOCRYL 0 CTX 36 (SUTURE) ×4
SUT PLAIN 1 NONE 54 (SUTURE) IMPLANT
SUT PLAIN 2 0 XLH (SUTURE) ×3 IMPLANT
SUT VIC AB 0 CT1 27 (SUTURE) ×6
SUT VIC AB 0 CT1 27XBRD ANBCTR (SUTURE) ×2 IMPLANT
SUT VIC AB 2-0 CT1 27 (SUTURE) ×3
SUT VIC AB 2-0 CT1 TAPERPNT 27 (SUTURE) ×1 IMPLANT
SUT VIC AB 4-0 KS 27 (SUTURE) IMPLANT
SYR BULB IRRIGATION 50ML (SYRINGE) ×3 IMPLANT
TOWEL OR 17X24 6PK STRL BLUE (TOWEL DISPOSABLE) ×3 IMPLANT
TRAY FOLEY CATH 14FR (SET/KITS/TRAYS/PACK) ×3 IMPLANT
WATER STERILE IRR 1000ML POUR (IV SOLUTION) ×3 IMPLANT

## 2013-12-13 NOTE — Brief Op Note (Signed)
12/13/2013  11:34 AM  PATIENT:  [name]  23 y.o. female  PRE-OPERATIVE DIAGNOSIS:  Repeat C/Section,   POST-OPERATIVE DIAGNOSIS:  Repeat C/Section,   PROCEDURE:  Procedure(s): REPEAT CESAREAN SECTION (N/A)  SURGEON:  Surgeon(s) and Role:    * Jaylin Roundy Bovard Stuckert, MD - Primary  ANESTHESIA:   spinal  EBL:  Total I/O In: 3400 [I.V.:3400] Out: 1000 [Urine:500; Blood:500]  BLOOD ADMINISTERED:none  DRAINS: Urinary Catheter (Foley)   LOCAL MEDICATIONS USED:  NONE  SPECIMEN:  Source of Specimen:  Placenta to L&D, cord gas to NICU  DISPOSITION OF SPECIMEN:  as above  COUNTS:  YES  TOURNIQUET:  * No tourniquets in log *  DICTATION: .Other Dictation: Dictation Number [id number]  PLAN OF CARE: Admit to inpatient   PATIENT DISPOSITION:  PACU - hemodynamically stable.   Delay start of Pharmacological VTE agent (>24hrs) due to surgical blood loss or risk of bleeding: not applicable

## 2013-12-13 NOTE — Transfer of Care (Signed)
Immediate Anesthesia Transfer of Care Note  Patient: [name]  Procedure(s) Performed: Procedure(s): REPEAT CESAREAN SECTION (N/A)  Patient Location: PACU  Anesthesia Type:Spinal  Level of Consciousness: awake, alert , oriented and patient cooperative  Airway & Oxygen Therapy: Patient Spontanous Breathing  Post-op Assessment: Report given to PACU RN and Post -op Vital signs reviewed and stable  Post vital signs: Reviewed and stable  Complications: No apparent anesthesia complications

## 2013-12-13 NOTE — Interval H&P Note (Signed)
History and Physical Interval Note:  12/13/2013 8:23 AM  [name]  has presented today for surgery, with the diagnosis of Repeat C/Section,   The various methods of treatment have been discussed with the patient and family. After consideration of risks, benefits and other options for treatment, the patient has consented to  Procedure(s): REPEAT CESAREAN SECTION (N/A) as a surgical intervention .  The patient's history has been reviewed, patient examined, no change in status, stable for surgery.  I have reviewed the patient's chart and labs.  Questions were answered to the patient's satisfaction.     Stuckert, Northrop GrummanJody Bovard

## 2013-12-13 NOTE — Lactation Note (Signed)
This note was copied from the chart of [name]. Lactation Consultation Note  Patient Name: [name] ZOXWR'UToday's Date: 12/13/2013 Reason for consult: Initial assessment of this second-time mom and her newborn at 10 hours of life.  Mom's first baby was born premature and with gastroschisis.  Mom pumped for 5 months and fed ebm but states that older baby never able to latch to breast.  Mom states that she knows how to hand express her milk and reports that her newborn is latching well.  Baby has had 6 feeds since birth and LATCH scores of 8 at last two assessments, per RN.  Feedings are lasting 10-45 minutes each.  Baby is sound asleep (STS)  on mom's chest at time of LC visit.  LC discussed and encouraged STS and cue feedings.  LC encouraged review of Baby and Me pp 9, 14 and 20-25 for STS and BF information. LC provided [facility] brochure and reviewed [facility] and list of community and web site resources.    Maternal Data Formula Feeding for Exclusion: No Infant to breast within first hour of birth: Yes (nursed intermittently for 45 minutes) Has patient been taught Hand Expression?: Yes (mom states that she knows hand expression technique) Does the patient have breastfeeding experience prior to this delivery?: Yes  Feeding Feeding Type: Breast Fed Length of feed: 10 min  LATCH Score/Interventions            Most recent LATCH score=8 (RN assessment)          Lactation Tools Discussed/Used   STS, cue feedings, hand expression  Consult Status Consult Status: Follow-up Date: 12/14/13 Follow-up type: In-patient    Warrick ParisianBryant, [name] [facility] 12/13/2013, 8:46 PM

## 2013-12-13 NOTE — Anesthesia Postprocedure Evaluation (Signed)
  Anesthesia Post-op Note  Patient: [name]  Procedure(s) Performed: Procedure(s): REPEAT CESAREAN SECTION (N/A)  Patient Location: Mother/Baby  Anesthesia Type:Spinal  Level of Consciousness: awake, alert  and oriented  Airway and Oxygen Therapy: Patient Spontanous Breathing  Post-op Pain: none  Post-op Assessment: Post-op Vital signs reviewed, Patient's Cardiovascular Status Stable, Respiratory Function Stable, No headache, No backache, No residual numbness and No residual motor weakness  Post-op Vital Signs: Reviewed and stable  Complications: No apparent anesthesia complications

## 2013-12-13 NOTE — Anesthesia Procedure Notes (Signed)
Spinal  Patient location during procedure: OR Preanesthetic Checklist Completed: patient identified, site marked, surgical consent, pre-op evaluation, timeout performed, IV checked, risks and benefits discussed and monitors and equipment checked Spinal Block Patient position: sitting Prep: DuraPrep Patient monitoring: heart rate, cardiac monitor, continuous pulse ox and blood pressure Approach: midline Location: L3-4 Injection technique: single-shot Needle Needle type: Sprotte  Needle gauge: 24 G Needle length: 9 cm Assessment Sensory level: T4 Additional Notes Spinal Dosage in OR  Bupivicaine ml       1.2 PFMS04   mcg        150 Fentanyl mcg            25    

## 2013-12-13 NOTE — Anesthesia Preprocedure Evaluation (Signed)

## 2013-12-13 NOTE — Addendum Note (Signed)
Addendum created 12/13/13 1505 by Shanon PayorSuzanne M Sharri Loya, CRNA   Modules edited: Notes Section   Notes Section:  File: [id number]

## 2013-12-13 NOTE — Anesthesia Postprocedure Evaluation (Signed)
  Anesthesia Post-op Note  Patient: [name]  Procedure(s) Performed: Procedure(s): REPEAT CESAREAN SECTION (N/A)  Patient Location: PACU  Anesthesia Type:Spinal  Level of Consciousness: awake, alert  and oriented  Airway and Oxygen Therapy: Patient Spontanous Breathing  Post-op Pain: none  Post-op Assessment: Post-op Vital signs reviewed, Patient's Cardiovascular Status Stable, Respiratory Function Stable, Patent Airway, No signs of Nausea or vomiting, Pain level controlled, No headache, No backache, No residual numbness and No residual motor weakness  Post-op Vital Signs: Reviewed and stable  Complications: No apparent anesthesia complications

## 2013-12-13 NOTE — Consult Note (Signed)
Neonatology Note:  Attendance at C-section:  I was asked by Dr. Stuckert to attend this repeat C/S at term. The mother is a G3P1A1 A pos, GBS neg with an uncomplicated pregnancy. ROM at delivery, fluid clear.Vacuum-assisted delivery. Infant a bit floppy and blue after delivery, but coughed with bulb suctioning and cried with stimulation, pinking up quickly. Needed bulb suctioning for clear oral secretions. Ap 7/9. Lungs clear to ausc in DR. To CN to care of Pediatrician.  [name] C. Jovian Lembcke, MD  

## 2013-12-14 LAB — CBC
HEMATOCRIT: 30.1 % — AB (ref 36.0–46.0)
HEMOGLOBIN: 9.7 g/dL — AB (ref 12.0–15.0)
MCH: 27.4 pg (ref 26.0–34.0)
MCHC: 32.2 g/dL (ref 30.0–36.0)
MCV: 85 fL (ref 78.0–100.0)
Platelets: 209 10*3/uL (ref 150–400)
RBC: 3.54 MIL/uL — ABNORMAL LOW (ref 3.87–5.11)
RDW: 14.3 % (ref 11.5–15.5)
WBC: 8.8 10*3/uL (ref 4.0–10.5)

## 2013-12-14 LAB — BIRTH TISSUE RECOVERY COLLECTION (PLACENTA DONATION)

## 2013-12-14 NOTE — Lactation Note (Signed)
This note was copied from the chart of [name]. Lactation Consultation Note  Patient Name: [name] ZOXWR'UToday's Date: 12/14/2013 Reason for consult: Follow-up assessment;Breast/nipple pain Asked by RN to see Mom as she was c/o of sore nipples. Positional stripes bilateral. Assisted Mom with positioning and obtaining more depth with latch. Mom has been using cradle hold and explained to her that newborns have trouble obtaining good depth with this position. Encouraged Mom to use cross cradle/football and to alternate positions with each feeding. Demonstrated how to bring bottom lip down. Care for sore nipples reviewed, comfort gels given with instructions. Advised to ask for assist as needed.   Maternal Data    Feeding Feeding Type: Breast Fed Length of feed: 20 min  LATCH Score/Interventions Latch: Grasps breast easily, tongue down, lips flanged, rhythmical sucking. Intervention(s): Adjust position;Assist with latch;Breast compression  Audible Swallowing: A few with stimulation  Type of Nipple: Everted at rest and after stimulation  Comfort (Breast/Nipple): Filling, red/small blisters or bruises, mild/mod discomfort  Problem noted: Cracked, bleeding, blisters, bruises;Mild/Moderate discomfort Interventions  (Cracked/bleeding/bruising/blister): Expressed breast milk to nipple Interventions (Mild/moderate discomfort): Comfort gels  Hold (Positioning): Assistance needed to correctly position infant at breast and maintain latch.  LATCH Score: 7  Lactation Tools Discussed/Used Tools: Comfort gels   Consult Status Consult Status: Follow-up Date: 12/15/13 Follow-up type: In-patient    Alfred LevinsGranger, Rella Egelston Ann 12/14/2013, 11:14 PM

## 2013-12-14 NOTE — Op Note (Signed)
NAMChristin [name]:  KELLY, Kennadee              ACCOUNT NO.:  [id number]  MEDICAL RECORD NO.:  [id number]  LOCATION:  9132                          FACILITY:  [facility]  PHYSICIAN:  Sherron MondayJody Bovard, MD        DATE OF BIRTH:  [birth year]  DATE OF PROCEDURE:  12/13/2013 DATE OF DISCHARGE:                              OPERATIVE REPORT   PREOPERATIVE DIAGNOSIS:  Intrauterine pregnancy at 39 weeks, declines trial of labor, desires repeat low transverse cesarean section.  POSTOPERATIVE DIAGNOSIS:  Intrauterine pregnancy at 39 weeks, declines trial of labor, desires repeat low transverse cesarean section, delivered.  PROCEDURE:  Repeat low-transverse cesarean section.  SURGEON:  Sherron MondayJody Bovard, MD  ANESTHESIA:  Spinal.  ESTIMATED BLOOD LOSS:  500 mL.  URINE OUTPUT:  500 mL clear urine at the end of the procedure.  IV FLUIDS:  3400 mL.  COMPLICATIONS:  None.  PATHOLOGY:  Placenta to L and D, cord gases to the NICU.  COMPLICATIONS:  None.  DESCRIPTION OF PROCEDURE:  After informed consent was reviewed with the patient including risks, benefits, and alternatives of the surgical procedure, she was transported to the OR, placed on the table in supine position with a leftward tilt after spinal anesthesia had been placed. The level was evaluated.  She was then prepped and draped in the normal sterile fashion.  Her Foley catheter was sterilely placed.  A Pfannenstiel skin incision was made at the level of her previous incision and carried through the underlying layer of fascia sharply. The fascia was incised in midline.  The incision was extended laterally with Mayo scissors.  Superior aspect of the fascial incision was grasped with Kocher clamps, elevated, and the rectus muscles were dissected off bluntly and sharply.  Midline was easily identified and the peritoneum was entered with the aid of hemostats and was entered sharply.  The incision was extended superiorly and inferiorly with good visualization of  the bladder.  The Alexis skin retractor was placed.  The uterus was assessed and incised in a transverse fashion.  Infant was delivered from the vertex presentation with the aid of a vacuum with several attempts due to lots of hair on the fetus.  Infant was delivered without complication.  Cord was clamped and cut.  Infant was handed off to the awaiting pediatric staff.  Cord gases were collected.  The placenta was expressed.  The uterus was cleared of all clot and debris.  The uterine incision was closed with 2 layers of 0 Monocryl, the first of which was running locked and the second as an imbricating layer.  The uterus, tubes, and ovaries were inspected and found to be normal.  The gutters were cleared of all clot and debris.  The Alexis retractor was removed. The peritoneum was reapproximated with 2-0 Vicryl.  The subfascial plane was inspected and found to be hemostatic.  The fascia was reapproximated with 0 Vicryl in a running fashion.  The subcuticular adipose layer was made hemostatic with Bovie cautery.  The dead space was closed with 3-0 plain gut, and the skin was closed with 4-0 Vicryl in a subcuticular stitch.  Benzoin and Steri-Strips were applied.  All sponge, lap, and needle  counts were correct x2 per the operating room staff.  The patient tolerated the procedure well.     Sherron Monday, MD     JB/MEDQ  D:  12/13/2013  T:  12/13/2013  Job:  [id number]

## 2013-12-14 NOTE — Progress Notes (Signed)
Subjective: Postpartum Day 1: Cesarean Delivery Patient reports incisional pain and tolerating PO.  Nl lochia, pain controlled.    Objective: Vital signs in last 24 hours: Temp:  [97.6 F (36.4 C)-98.7 F (37.1 C)] 98.3 F (36.8 C) (04/04 0625) Pulse Rate:  [60-90] 83 (04/04 0625) Resp:  [12-21] 18 (04/04 0625) BP: (91-108)/(50-64) 96/57 mmHg (04/04 0625) SpO2:  [96 %-100 %] 99 % (04/04 0625) Weight:  [77.565 kg (171 lb)] 77.565 kg (171 lb) (04/03 1310)  Physical Exam:  General: alert and no distress Lochia: appropriate Uterine Fundus: firm Incision: healing well DVT Evaluation: No evidence of DVT seen on physical exam.   Recent Labs  12/12/13 1230 12/14/13 0549  HGB 10.7* 9.7*  HCT 32.6* 30.1*    Assessment/Plan: Status post Cesarean section. Doing well postoperatively.  Continue current care.  Routine care.    Stuckert, [name] 12/14/2013, 8:02 AM

## 2013-12-15 MED ORDER — PRENATAL MULTIVITAMIN CH: 1.0000 | ORAL_TABLET | Freq: Every day | ORAL | Status: DC

## 2013-12-15 MED ORDER — IBUPROFEN 800 MG PO TABS: 800.0000 mg | ORAL_TABLET | Freq: Three times a day (TID) | ORAL | Status: DC

## 2013-12-15 MED ORDER — OXYCODONE-ACETAMINOPHEN 5-325 MG PO TABS: 1.0000 | ORAL_TABLET | Freq: Four times a day (QID) | ORAL | Status: DC | PRN

## 2013-12-15 NOTE — Discharge Summary (Signed)
Obstetric Discharge Summary Reason for Admission: cesarean section Prenatal Procedures: none Intrapartum Procedures: cesarean: low cervical, transverse Postpartum Procedures: none Complications-Operative and Postpartum: none Hemoglobin  Date Value Ref Range Status  12/14/2013 9.7* 12.0 - 15.0 g/dL Final     HCT  Date Value Ref Range Status  12/14/2013 30.1* 36.0 - 46.0 % Final    Physical Exam:  General: alert and no distress Lochia: appropriate Uterine Fundus: firm Incision: healing well DVT Evaluation: No evidence of DVT seen on physical exam.  Discharge Diagnoses: Term Pregnancy-delivered  Discharge Information: Date: 12/15/2013 Activity: pelvic rest Diet: routine Medications: PNV, Ibuprofen and Percocet Condition: stable Instructions: refer to practice specific booklet Discharge to: home Follow-up Information   Follow up with [name], Cindy MondayJody Bovard, MD. Schedule an appointment as soon as possible for a visit in 2 weeks. (for incision; 6 wks for full postpartum check)    Specialty:  Obstetrics and Gynecology   Contact information:   [address] [place] [id number] [phone]       Newborn Data: Live born female  Birth Weight: 7 lb 10.6 oz (3475 g) APGAR: 7, 9  Home with mother.  [name], [name] 12/15/2013, 8:27 AM

## 2013-12-15 NOTE — Progress Notes (Addendum)
Subjective: Postpartum Day 2: Cesarean Delivery Patient reports nausea, vomiting and tolerating PO.  Nl lochia, pain controlled.    Objective: Vital signs in last 24 hours: Temp:  [97.7 F (36.5 C)-98.4 F (36.9 C)] 98.4 F (36.9 C) (04/05 0711) Pulse Rate:  [61-79] 79 (04/05 0711) Resp:  [18-19] 18 (04/05 0711) BP: (96-100)/(51-63) 100/62 mmHg (04/05 0711) SpO2:  [98 %] 98 % (04/04 0933)  Physical Exam:  General: alert and no distress Lochia: appropriate Uterine Fundus: firm Incision: healing well DVT Evaluation: No evidence of DVT seen on physical exam.   Recent Labs  12/12/13 1230 12/14/13 0549  HGB 10.7* 9.7*  HCT 32.6* 30.1*    Assessment/Plan: Status post Cesarean section. Doing well postoperatively.  Continue current care.  Pt desires d/c home - will d/c Motrin, percocet, and PNV.  F/i 2 and 6 weeks  Stuckert, Chez Bulnes Bovard 12/15/2013, 8:04 AM

## 2013-12-16 ENCOUNTER — Encounter (HOSPITAL_COMMUNITY): Payer: Self-pay | Admitting: Obstetrics and Gynecology

## 2013-12-16 NOTE — Progress Notes (Signed)
Ur chart review completed.  

## 2014-01-02 IMAGING — US US OB COMP LESS 14 WK
1 series · 14 of 17 positions shown · non-contrast
Comparison: 04/16/2013

CLINICAL DATA: Viability. Estimated gestational age by LMP is 9
weeks 1 day.

OBSTETRIC <14 WK ULTRASOUND
TECHNIQUE: Transabdominal ultrasound was performed for evaluation
of the gestation as well as the maternal uterus and adnexal
regions.

[Series 1: us ob transvaginal · 17 acquisitions, 14 frames shown]
[im 1/17]
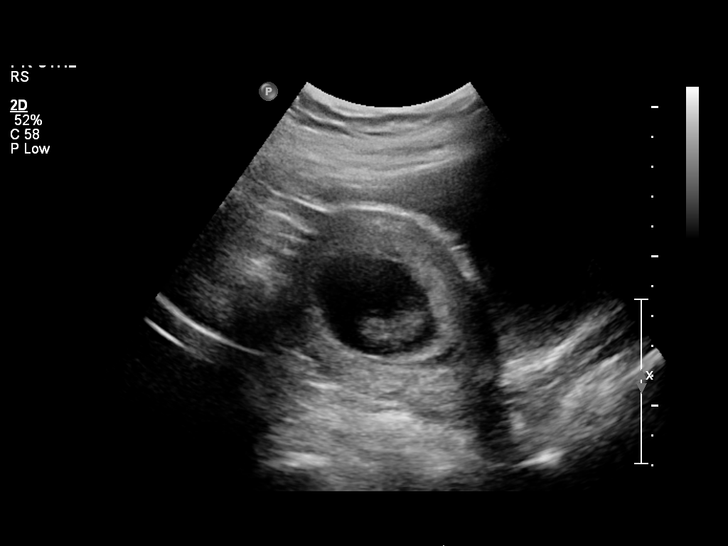
[im 2/17]
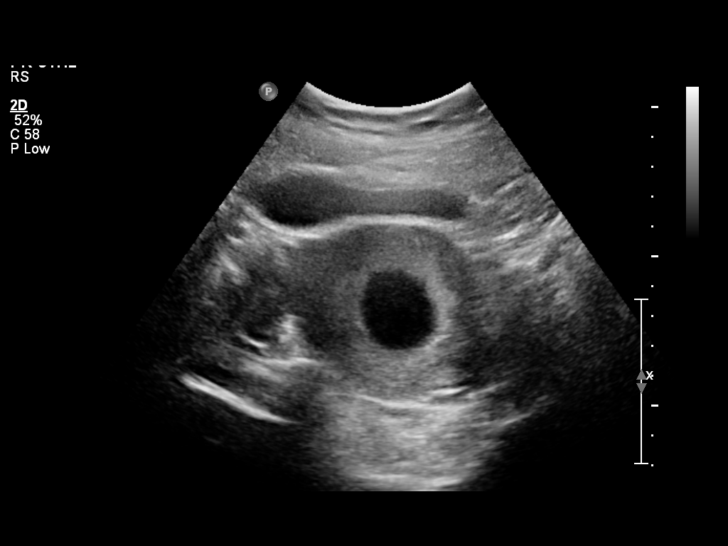
[im 4/17]
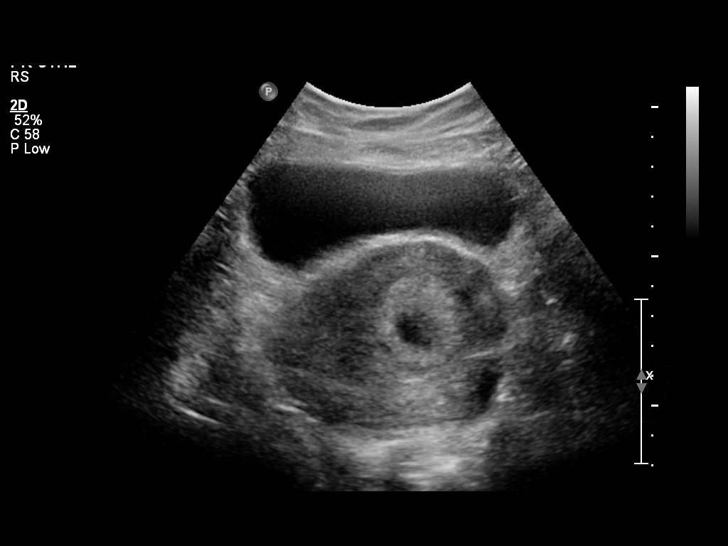
[im 5/17]
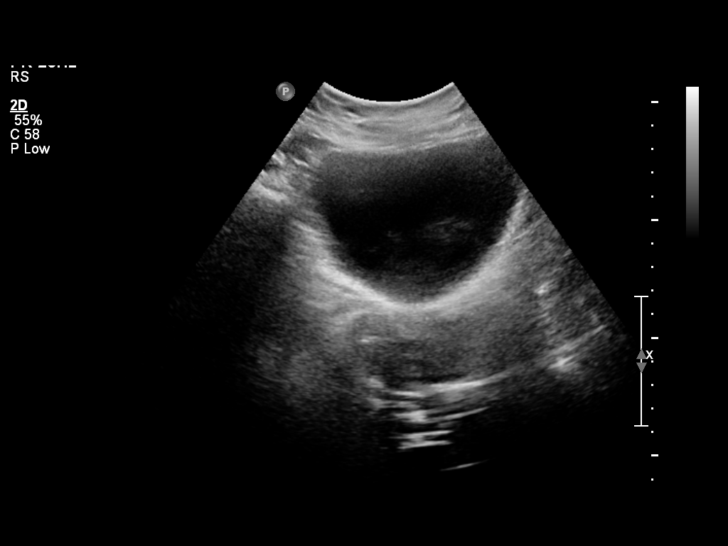
[im 6/17]
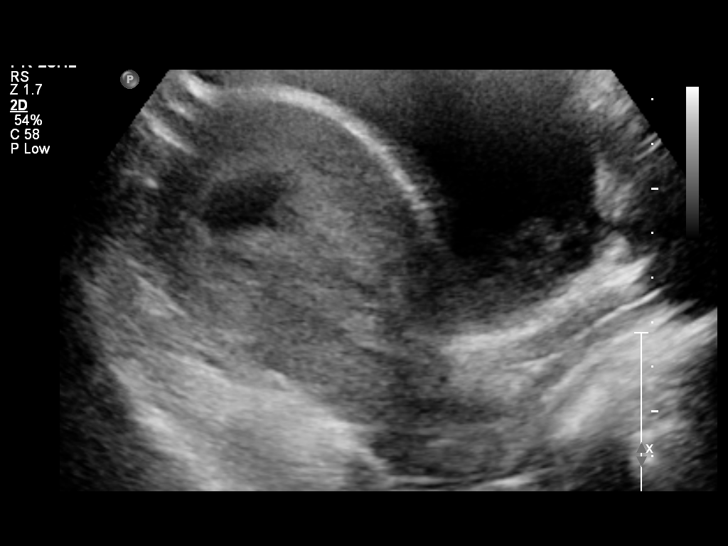
[im 7/17]
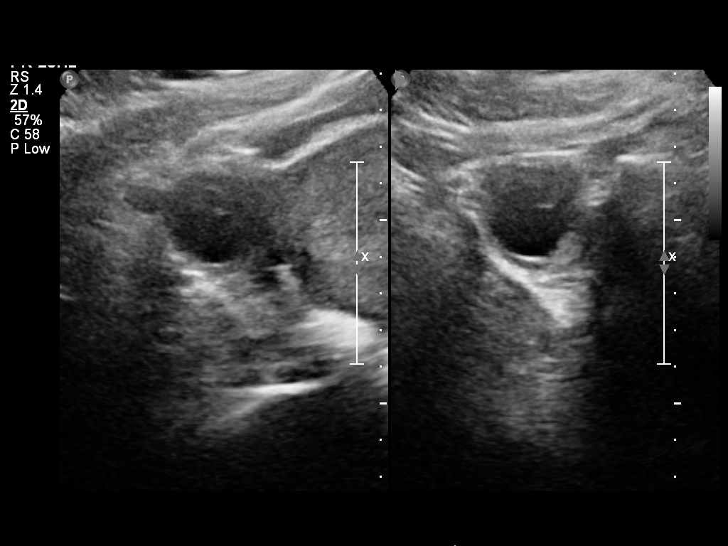
[im 8/17]
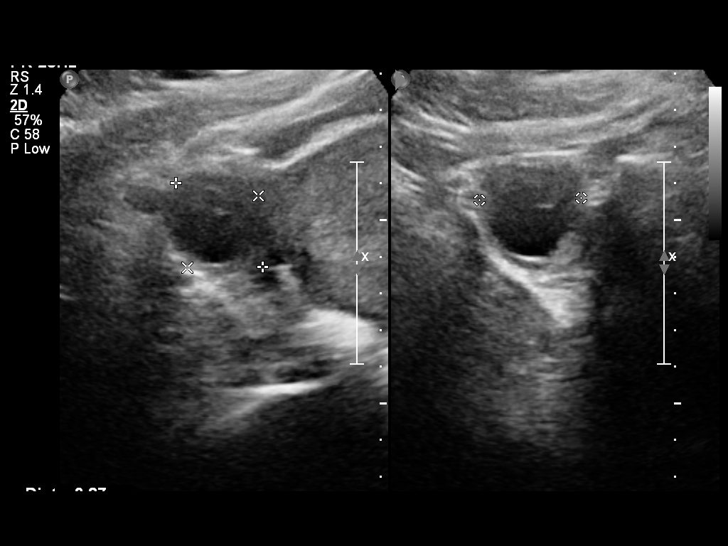
[im 10/17]
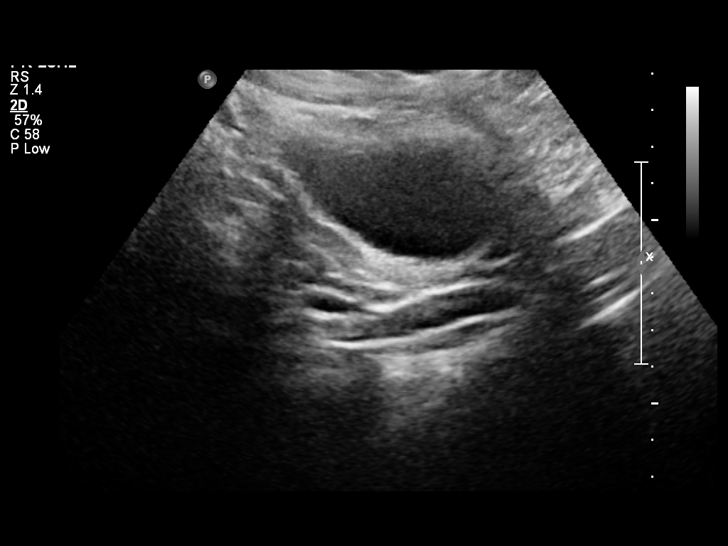
[im 11/17]
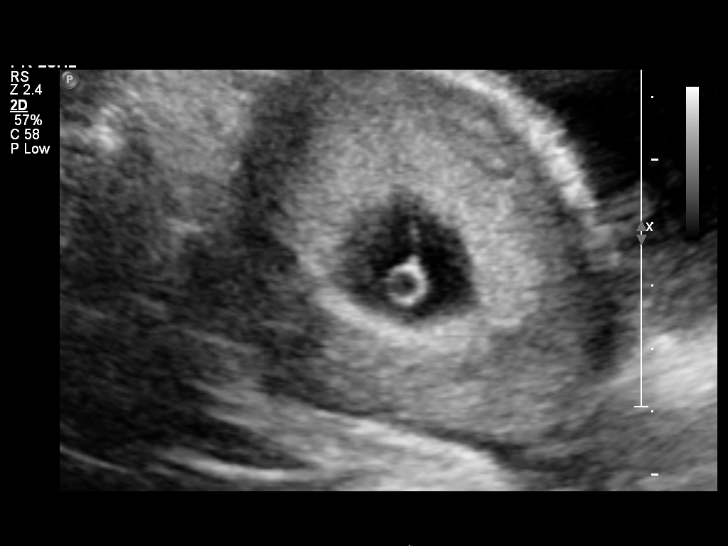
[im 12/17]
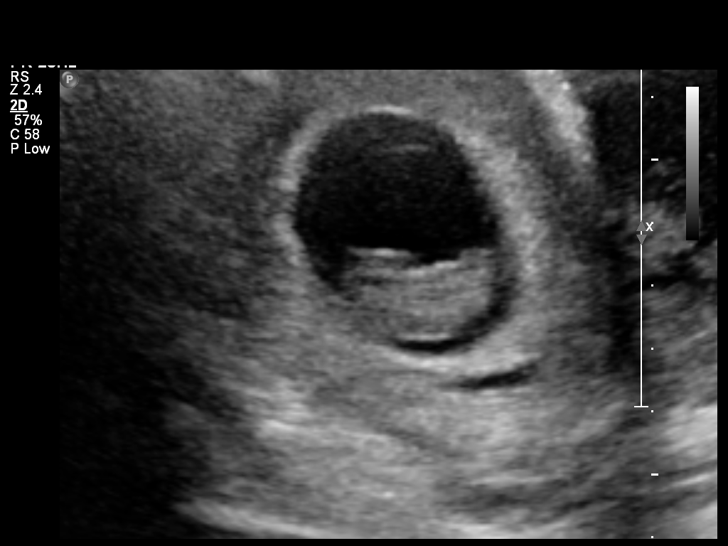
[im 13/17]
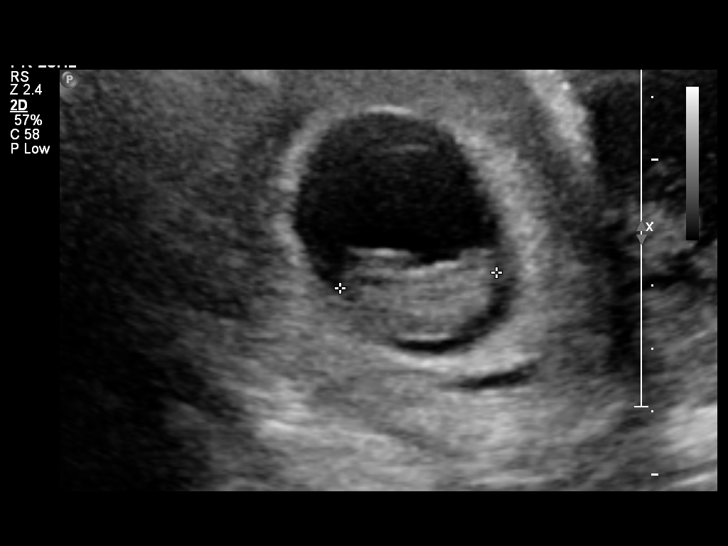
[im 14/17]
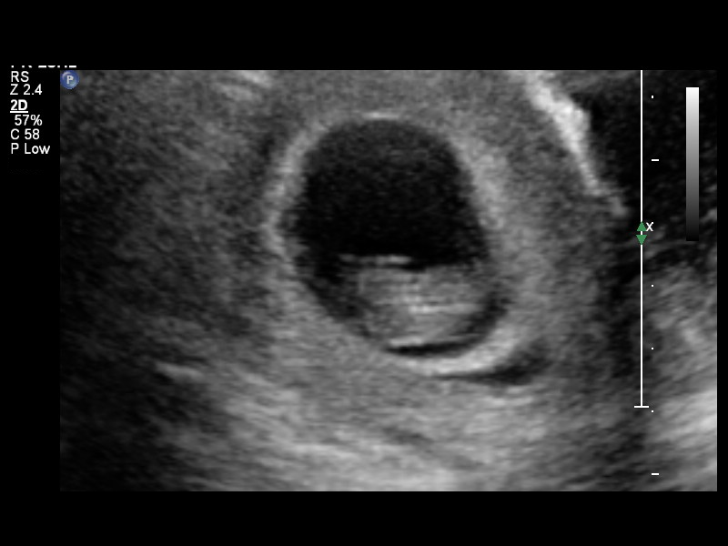
[im 16/17]
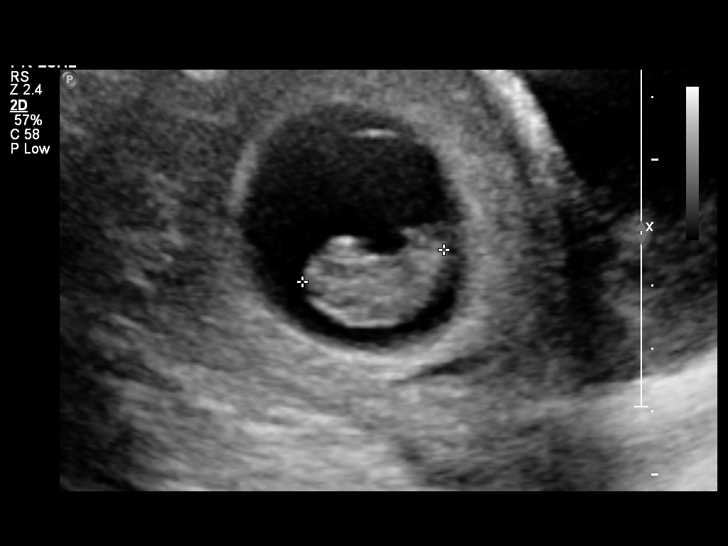
[im 17/17]
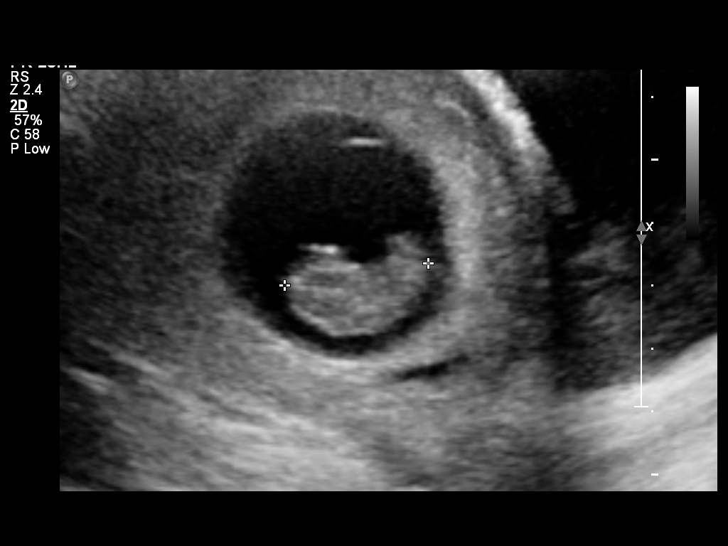

[14 of 17 positions shown; findings below may reference images not displayed]

Intrauterine gestational sac: A single intrauterine gestational sac
is visualized.
Yolk sac: The yolk sac is present.
Embryo: Fetal pole is identified.
Cardiac Activity: Fetal cardiac activity is identified.
Heart Rate: 174 bpm

CRL:  24 mm  9 w  1 d         US EDC: 12/20/2013

Maternal uterus/Adnexae:
No visualized myometrial masses.  No evidence of subchorionic
hemorrhage.  Right ovary is visualized, measuring 3.3 x 2.7 x
cm.  Small cyst is demonstrated.  No abnormal adnexal masses.  Left
ovary is not visualized.  No free pelvic fluid collections.
IMPRESSION: Single living intrauterine pregnancy.  Estimated gestational age by
crown-rump length is 9 weeks 1 day.  This represents appropriate
growth since the previous study.

## 2014-01-02 IMAGING — US US ABDOMEN LIMITED
1 series · 14 of 25 positions shown · non-contrast
Comparison: None.

CLINICAL DATA: .  Right upper quadrant and generalized abdominal
pain.

LIMITED ABDOMINAL ULTRASOUND - RIGHT UPPER QUADRANT

[Series 1: us abdomen limited ruq/ascites · 14 of 32 slices shown]
[im 1/32]
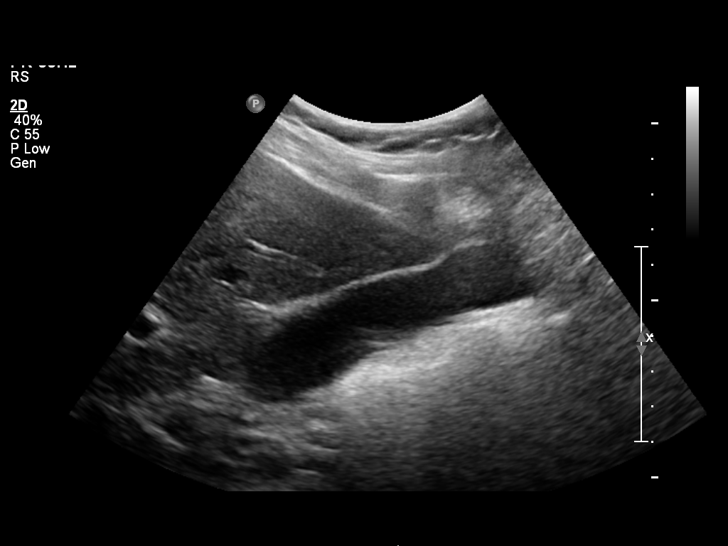
[im 3/32]
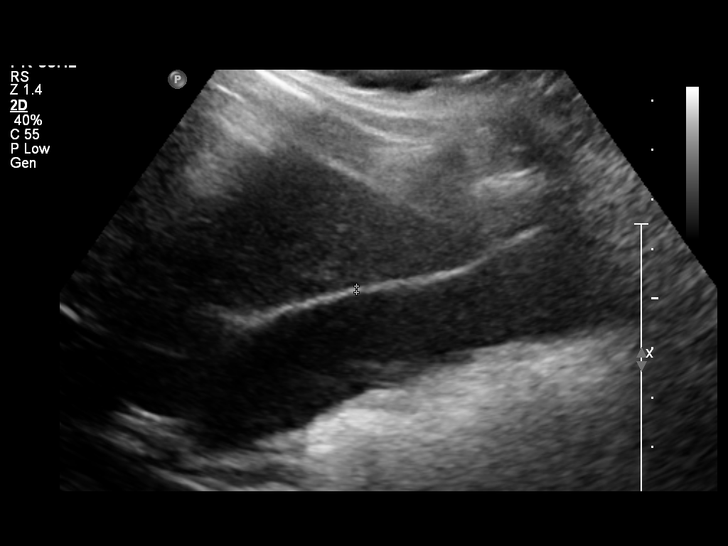
[im 6/32]
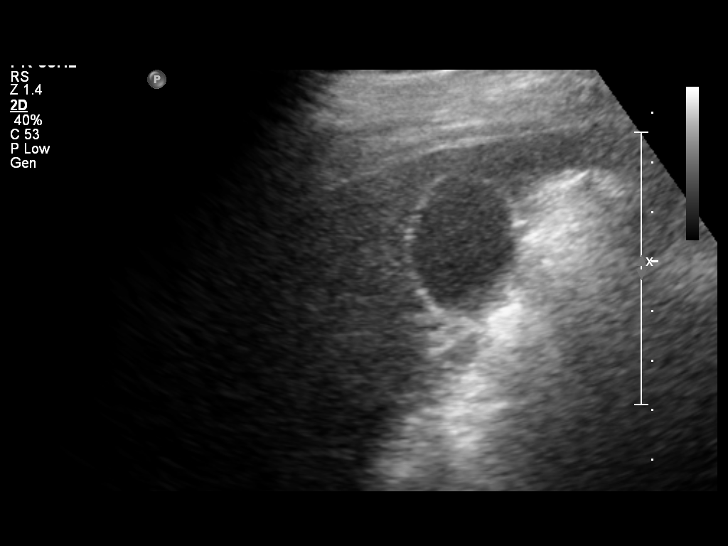
[im 8/32]
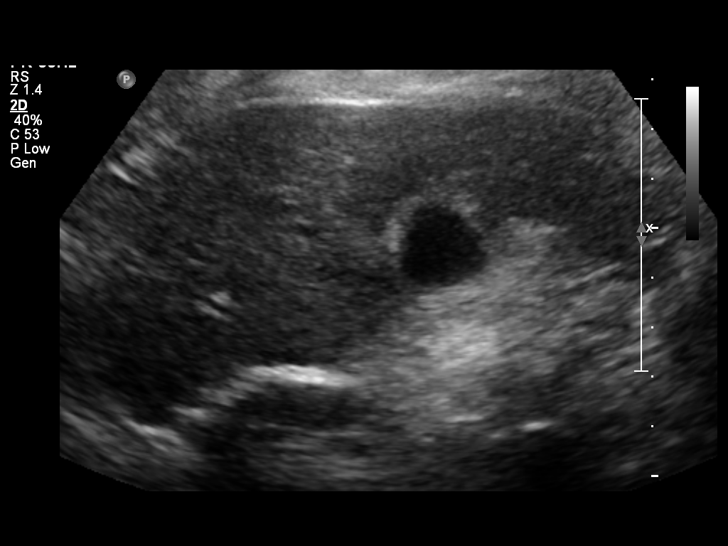
[im 11/32]
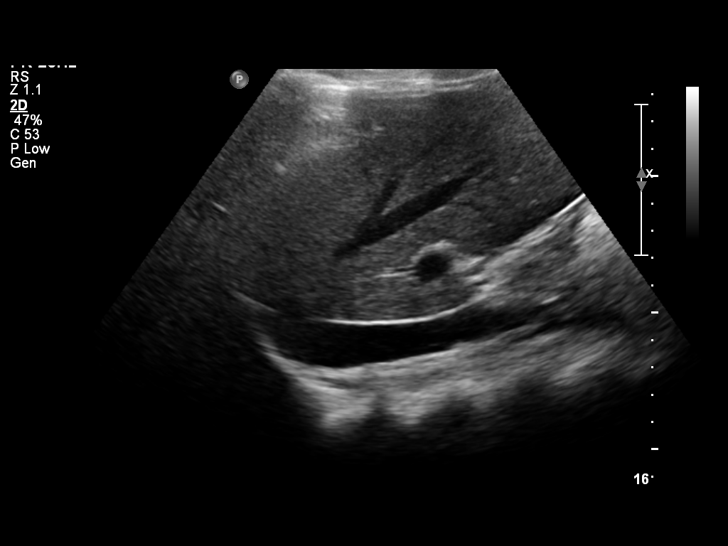
[im 12/32]
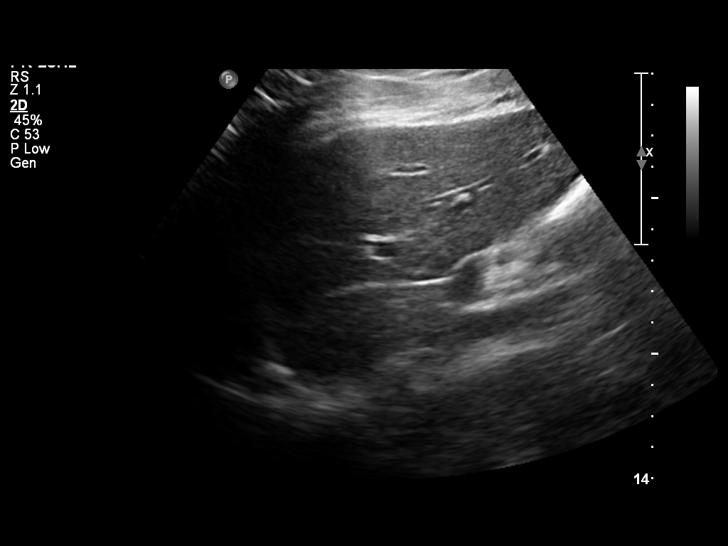
[im 15/32]
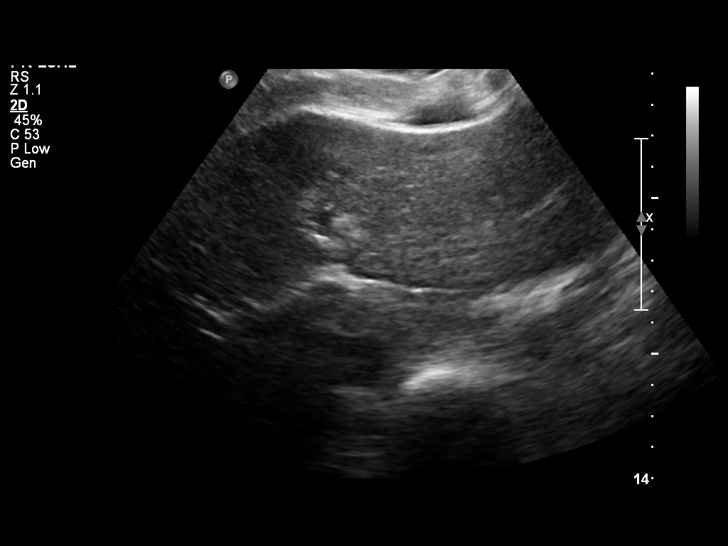
[im 17/32]
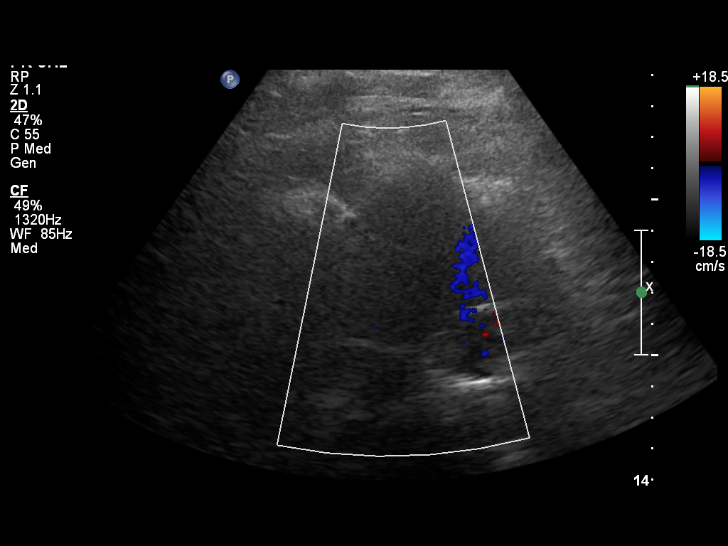
[im 20/32]
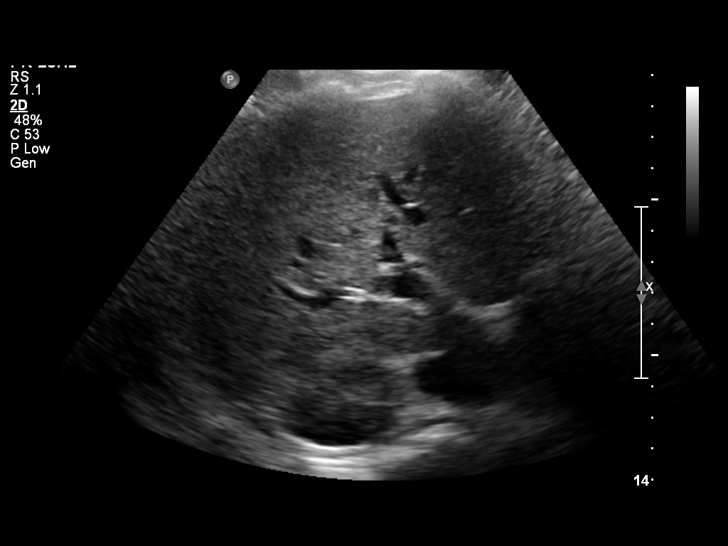
[im 21/32]
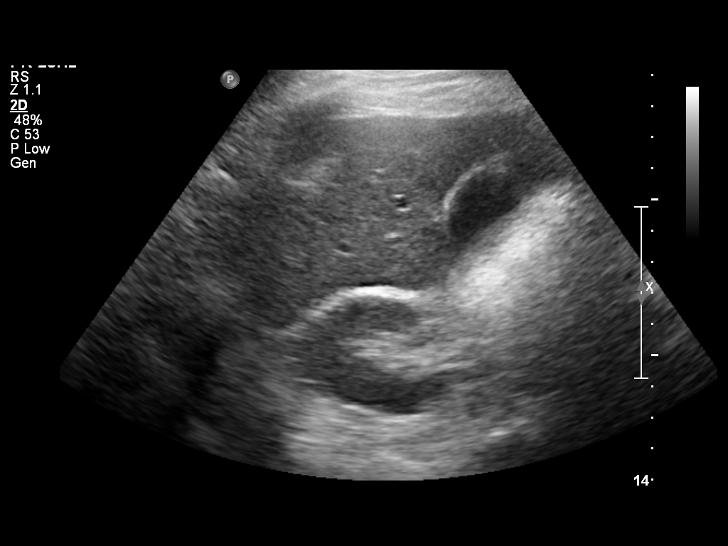
[im 24/32]
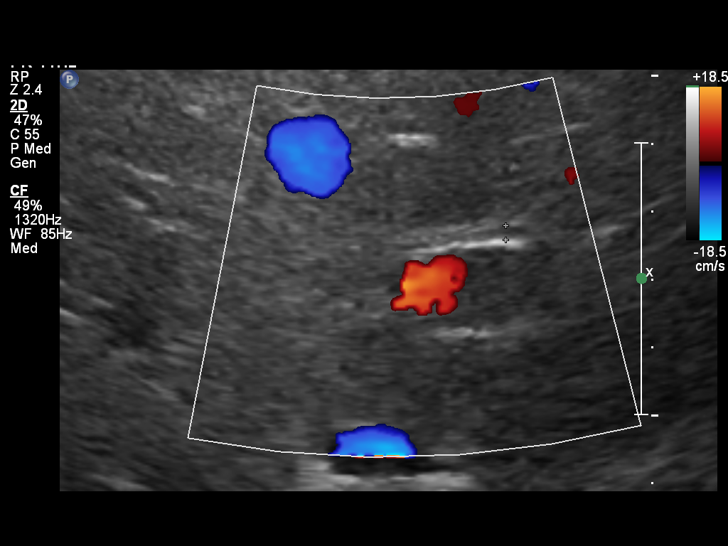
[im 26/32]
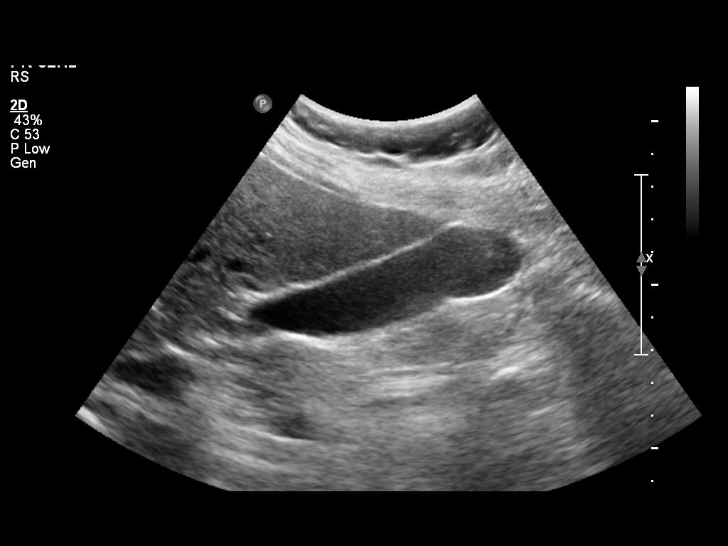
[im 29/32]
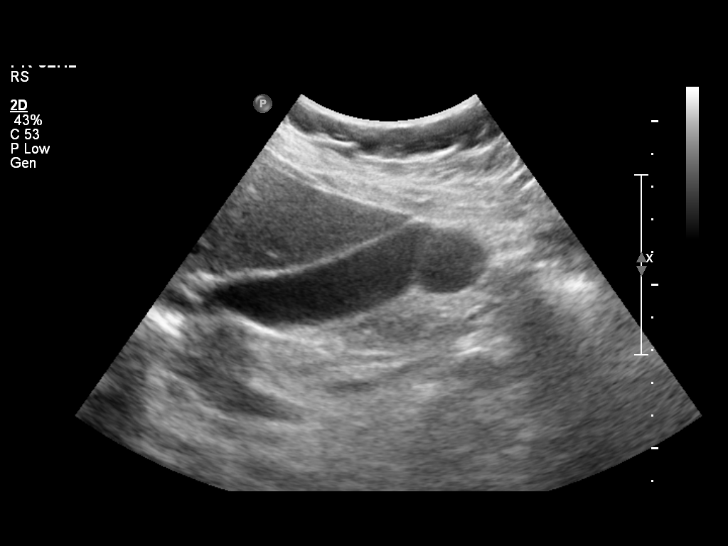
[im 32/32]
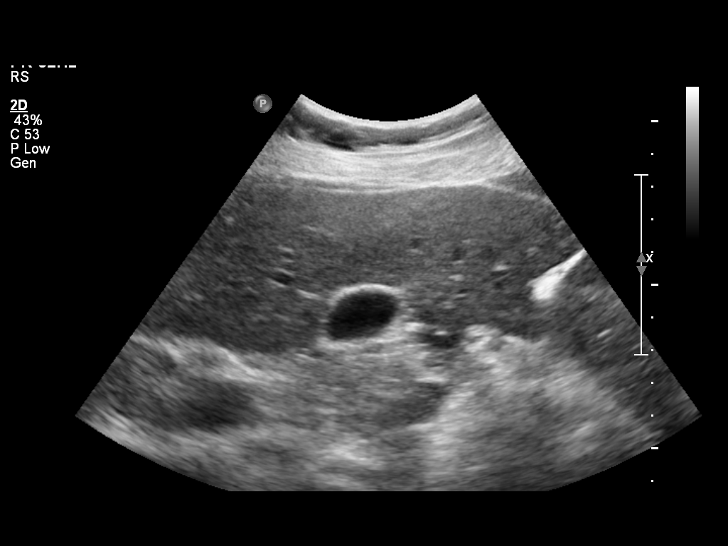

[14 of 25 positions shown; findings below may reference images not displayed]

FINDINGS: Gallbladder:  Gallbladder is normal.  No stones, sludge, wall
thickening, or edema.  Murphy's sign is indeterminate because the
patient is diffusely tender to the touch.

Common bile duct:  Normal caliber.  Diameter is measured at 2.1 mm.

Liver:  Normal homogeneous parenchymal echotexture.  No focal
lesions identified.
IMPRESSION: No evidence of cholelithiasis or cholecystitis.

## 2014-07-14 ENCOUNTER — Encounter (HOSPITAL_COMMUNITY): Payer: Self-pay | Admitting: Obstetrics and Gynecology

## 2014-10-07 LAB — OB RESULTS CONSOLE RUBELLA ANTIBODY, IGM: Rubella: IMMUNE

## 2014-10-07 LAB — OB RESULTS CONSOLE ANTIBODY SCREEN: Antibody Screen: NEGATIVE

## 2014-10-07 LAB — OB RESULTS CONSOLE HEPATITIS B SURFACE ANTIGEN: Hepatitis B Surface Ag: NEGATIVE

## 2014-10-07 LAB — OB RESULTS CONSOLE GC/CHLAMYDIA
Chlamydia: NEGATIVE
Gonorrhea: NEGATIVE

## 2014-10-07 LAB — OB RESULTS CONSOLE RPR: RPR: NONREACTIVE

## 2014-10-07 LAB — OB RESULTS CONSOLE ABO/RH: RH TYPE: POSITIVE

## 2014-10-07 LAB — OB RESULTS CONSOLE HIV ANTIBODY (ROUTINE TESTING): HIV: NONREACTIVE

## 2015-02-17 ENCOUNTER — Encounter (HOSPITAL_COMMUNITY): Payer: Self-pay | Admitting: *Deleted

## 2015-02-17 ENCOUNTER — Inpatient Hospital Stay (HOSPITAL_COMMUNITY)
Admission: AD | Admit: 2015-02-17 | Discharge: 2015-02-17 | Disposition: A | Discharge status: Home / Self Care | Payer: Medicaid Other | Source: Ambulatory Visit | Attending: Obstetrics and Gynecology | Admitting: Obstetrics and Gynecology

## 2015-02-17 DIAGNOSIS — R109 Unspecified abdominal pain: Secondary | ICD-10-CM | POA: Diagnosis present

## 2015-02-17 DIAGNOSIS — Z3A32 32 weeks gestation of pregnancy: Secondary | ICD-10-CM | POA: Insufficient documentation

## 2015-02-17 LAB — FETAL FIBRONECTIN: FETAL FIBRONECTIN: NEGATIVE

## 2015-02-17 LAB — URINALYSIS, ROUTINE W REFLEX MICROSCOPIC
Bilirubin Urine: NEGATIVE
Glucose, UA: NEGATIVE mg/dL
HGB URINE DIPSTICK: NEGATIVE
Ketones, ur: NEGATIVE mg/dL
Leukocytes, UA: NEGATIVE
NITRITE: NEGATIVE
Protein, ur: NEGATIVE mg/dL
Specific Gravity, Urine: 1.015 (ref 1.005–1.030)
UROBILINOGEN UA: 0.2 mg/dL (ref 0.0–1.0)
pH: 8 (ref 5.0–8.0)

## 2015-02-17 LAB — AMNISURE RUPTURE OF MEMBRANE (ROM) NOT AT ARMC: AMNISURE: NEGATIVE

## 2015-02-17 MED ORDER — TERBUTALINE SULFATE 1 MG/ML IJ SOLN
0.2500 mg | Freq: Once | INTRAMUSCULAR | Status: AC
  Administered 2015-02-17: 10:00:00 via SUBCUTANEOUS
  Filled 2015-02-17: qty 1

## 2015-02-17 NOTE — Discharge Instructions (Signed)
Fetal Fibronectin °This is a test done to help evaluate a pregnant woman's risk of pre-term delivery. It is generally done when you are 26 to [redacted] weeks pregnant and are having symptoms of premature labor. A Dacron swab is used to take a sample of cervical or vaginal fluid from the back portion of the vagina or from the area just outside the opening of the cervix. °Fetal fibronectin (fFN) is a glycoprotein that can be used to help predict the short term risk of premature delivery. fFN is produced at the boundary between the amniotic sac and the lining of the mother's uterus. This is called the unteroplacental junction. Fetal fibronectin is largely confined to this junction and thought to help maintain the integrity of the boundary. fFN is normally detectable in cervicovaginal fluid during the first 20 to 24 weeks of pregnancy, and then is detectable again after about 36 weeks.  °Finding fFN in cervicovaginal fluids after 36 weeks is not unusual as it is often released by the body as it gets ready for childbirth. The elevated fFN found in vaginal fluids early in pregnancy may simply reflect the normal growth and establishment of tissues at the unteroplacental junction with levels falling when this phase is complete. What is known is that fFN that is detected between 24 and 36 weeks of pregnancy is not normal. Elevated levels reflect a disturbance at the uteroplacental junction and have been associated with an increased risk of pre-term labor and delivery. Knowing whether or not a woman is likely to deliver prematurely helps your caregiver plan a course of action. The fFN test is a relatively non-invasive tool to help the caregiver to distinguish between those who are likely to deliver shortly and those who are not.  °PREPARATION FOR TEST  °· Inform the person conducting the test if you have a medical condition or are using any medications that cause excessive bleeding. °· Do not have sexual intercourse for 24 hours  before the procedure. °NORMAL FINDINGS  °Pregnancy = 50 nanograms/ml °Ranges for normal findings may vary among different laboratories and hospitals. You should always check with your doctor after having lab work or other tests done to discuss the meaning of your test results and whether your values are considered within normal limits. °MEANING OF TEST  °Your caregiver will go over the test results with you and discuss the importance and meaning of your results, as well as treatment options and the need for additional tests if necessary. °OBTAINING THE TEST RESULTS  °It is your responsibility to obtain your test results. Ask the lab or department performing the test when and how you will get your results. °Document Released: 06/30/2004 Document Revised: 11/21/2011 Document Reviewed: 11/25/2013 °ExitCare® Patient Information ©2015 [employer]. This information is not intended to replace advice given to you by your health care provider. Make sure you discuss any questions you have with your health care provider. ° °Preterm Labor Information °Preterm labor is when labor starts at less than 37 weeks of pregnancy. The normal length of a pregnancy is 39 to 41 weeks. °CAUSES °Often, there is no identifiable underlying cause as to why a woman goes into preterm labor. One of the most common known causes of preterm labor is infection. Infections of the uterus, cervix, vagina, amniotic sac, bladder, kidney, or even the lungs (pneumonia) can cause labor to start. Other suspected causes of preterm labor include:  °· Urogenital infections, such as yeast infections and bacterial vaginosis.   °· Uterine abnormalities (uterine shape,   uterine septum, fibroids, or bleeding from the placenta).   A cervix that has been operated on (it may fail to stay closed).   Malformations in the fetus.   Multiple gestations (twins, triplets, and so on).   Breakage of the amniotic sac.  RISK FACTORS  Having a previous history of  preterm labor.   Having premature rupture of membranes (PROM).   Having a placenta that covers the opening of the cervix (placenta previa).   Having a placenta that separates from the uterus (placental abruption).   Having a cervix that is too weak to hold the fetus in the uterus (incompetent cervix).   Having too much fluid in the amniotic sac (polyhydramnios).   Taking illegal drugs or smoking while pregnant.   Not gaining enough weight while pregnant.   Being younger than 1018 and older than 24 years old.   Having a low socioeconomic status.   Being African American. SYMPTOMS Signs and symptoms of preterm labor include:   Menstrual-like cramps, abdominal pain, or back pain.  Uterine contractions that are regular, as frequent as six in an hour, regardless of their intensity (may be mild or painful).  Contractions that start on the top of the uterus and spread down to the lower abdomen and back.   A sense of increased pelvic pressure.   A watery or bloody mucus discharge that comes from the vagina.  TREATMENT Depending on the length of the pregnancy and other circumstances, your health care provider may suggest bed rest. If necessary, there are medicines that can be given to stop contractions and to mature the fetal lungs. If labor happens before 34 weeks of pregnancy, a prolonged hospital stay may be recommended. Treatment depends on the condition of both you and the fetus.  WHAT SHOULD YOU DO IF YOU THINK YOU ARE IN PRETERM LABOR? Call your health care provider right away. You will need to go to the hospital to get checked immediately. HOW CAN YOU PREVENT PRETERM LABOR IN FUTURE PREGNANCIES? You should:   Stop smoking if you smoke.  Maintain healthy weight gain and avoid chemicals and drugs that are not necessary.  Be watchful for any type of infection.  Inform your health care provider if you have a known history of preterm labor. Document Released:  11/19/2003 Document Revised: 05/01/2013 Document Reviewed: 10/01/2012 [facility] Patient Information 2015 [place]. This information is not intended to replace advice given to you by your health care provider. Make sure you discuss any questions you have with your health care provider.

## 2015-02-17 NOTE — Progress Notes (Signed)
Patient denies sexual intercourse in the last 48 hours, explained there is specific testing that we will do to determine if PROM and in preterm labor.

## 2015-02-17 NOTE — MAU Note (Signed)
3rd child, never had anything like this. Has been up since 0200 with cramps. Ongoing, not letting up.   Took a shower, hoping it would help, had a gush of milky fluid.

## 2015-02-17 NOTE — MAU Provider Note (Signed)
History     CSN: [id number]  Arrival date and time: 02/17/15 4098   First Provider Initiated Contact with Patient 02/17/15 1011      No chief complaint on file.  HPI. This is a 24 y.o. female at [redacted]w[redacted]d who presents with c/o cramping which started about 2am.  Tried shower and water, but they got worse .Noted some white fluid after getting shower.  Denies bleeding.  RN Note 3rd child, never had anything like this. Has been up since 0200 with cramps. Ongoing, not letting up. Took a shower, hoping it would help, had a gush of milky fluid.           Expand All Collapse All   Patient denies sexual intercourse in the last 48 hours, explained there is specific testing that we will do to determine if PROM and in preterm labor.           OB History    Gravida Para Term Preterm AB TAB SAB Ectopic Multiple Living   Obstetric Comments   Baby with gastroschesis      Past Medical History  Diagnosis Date  . No pertinent past medical history   . Medical history non-contributory   . S/P cesarean section 12/13/2013    Past Surgical History  Procedure Laterality Date  . Cesarean section      Emergent  . Cesarean section    . Laparoscopic appendectomy N/A 05/18/2013    Procedure: APPENDECTOMY LAPAROSCOPIC;  Surgeon: Atilano Ina, MD;  Location: [facility] OR;  Service: General;  Laterality: N/A;  . Cesarean section N/A 12/13/2013    Procedure: REPEAT CESAREAN SECTION;  Surgeon: Sherian Rein, MD;  Location: [facility] ORS;  Service: Obstetrics;  Laterality: N/A;    Family History  Problem Relation Age of Onset  . Other Neg Hx     History  Substance Use Topics  . Smoking status: Never Smoker   . Smokeless tobacco: Never Used  . Alcohol Use: No    Allergies: No Known Allergies  Prescriptions prior to admission  Medication Sig Dispense Refill Last Dose  . Prenatal Vit-Fe Fumarate-FA (PRENATAL MULTIVITAMIN) TABS tablet Take 1 tablet by mouth daily at 12  noon. 30 tablet 12 02/16/2015 at Unknown time    Review of Systems  Constitutional: Positive for malaise/fatigue (from lack of sleep). Negative for fever and chills.  Gastrointestinal: Positive for abdominal pain. Negative for nausea, vomiting, diarrhea and constipation.  Genitourinary: Negative for dysuria, urgency and frequency.  Neurological: Negative for dizziness, focal weakness, weakness and headaches.   Physical Exam   Blood pressure 119/65, pulse 100, temperature 98 F (36.7 C), temperature source Oral, resp. rate 18, height  (1.6 m), weight 172 lb 12.8 oz (78.382 kg), unknown if currently breastfeeding.  Physical Exam  Constitutional: She is oriented to person, place, and time. She appears well-developed and well-nourished. No distress.  HENT:  Head: Normocephalic.  Cardiovascular: Normal rate, regular rhythm and normal heart sounds.  Exam reveals no gallop and no friction rub.   No murmur heard. Respiratory: Effort normal and breath sounds normal. No respiratory distress. She has no wheezes. She has no rales. She exhibits no tenderness.  GI: Soft. She exhibits no distension and no mass. There is tenderness (mild over lower abdomen). There is no rebound and no guarding.  Genitourinary: Vaginal discharge found.  Speculum exam Thin white discharge No pooling No ferning Dilation: Closed Effacement (%):  20 Cervical Position: Posterior Station: -3 Presentation: Vertex Exam by:: MW   Musculoskeletal: Normal range of motion.  Neurological: She is alert and oriented to person, place, and time.  Skin: Skin is warm and dry.  Psychiatric: She has a normal mood and affect.    MAU Course  Procedures  MDM Consulted Dr Ambrose MantleHenley Will try a dose of Terbutaline  >>> worked well. Cramping stopped, patient sleeping Results for orders placed or performed during the hospital encounter of 02/17/15 (from the past 72 hour(s))  Urinalysis, Routine w reflex microscopic ([facility])      Status: None   Collection Time: 02/17/15  7:20 AM  Result Value Ref Range   Color, Urine YELLOW YELLOW   APPearance CLEAR CLEAR   Specific Gravity, Urine 1.015 1.005 - 1.030   pH 8.0 5.0 - 8.0   Glucose, UA NEGATIVE NEGATIVE mg/dL   Hgb urine dipstick NEGATIVE NEGATIVE   Bilirubin Urine NEGATIVE NEGATIVE   Ketones, ur NEGATIVE NEGATIVE mg/dL   Protein, ur NEGATIVE NEGATIVE mg/dL   Urobilinogen, UA 0.2 0.0 - 1.0 mg/dL   Nitrite NEGATIVE NEGATIVE   Leukocytes, UA NEGATIVE NEGATIVE    Comment: MICROSCOPIC NOT DONE ON URINES WITH NEGATIVE PROTEIN, BLOOD, LEUKOCYTES, NITRITE, OR GLUCOSE <1000 mg/dL.  Fetal fibronectin     Status: None   Collection Time: 02/17/15  9:41 AM  Result Value Ref Range   Fetal Fibronectin NEGATIVE NEGATIVE  Amnisure rupture of membrane (rom)not at [facility]     Status: None   Collection Time: 02/17/15  9:41 AM  Result Value Ref Range   Amnisure ROM NEGATIVE     Assessment and Plan  A:  SIUP at 3256w6d       Preterm contractions, resolved with one dose of Terbutaline      Negative exam and Amnisure      Negative FFn  P:  Reconsulted Dr Ambrose MantleHenley       Discharge home      PTL precautions      Keep appt in office for prenatal care  [facility] Hospital[facility]ILLIAMS,Aislynn Cifelli 02/17/2015, 10:17 AM   ER 4

## 2015-02-27 ENCOUNTER — Inpatient Hospital Stay (HOSPITAL_COMMUNITY)
Admission: AD | Admit: 2015-02-27 | Discharge: 2015-02-28 | Disposition: A | Discharge status: Home / Self Care | Payer: Medicaid Other | Source: Ambulatory Visit | Attending: Obstetrics and Gynecology | Admitting: Obstetrics and Gynecology

## 2015-02-27 ENCOUNTER — Encounter (HOSPITAL_COMMUNITY): Payer: Self-pay | Admitting: *Deleted

## 2015-02-27 DIAGNOSIS — Z3A34 34 weeks gestation of pregnancy: Secondary | ICD-10-CM | POA: Insufficient documentation

## 2015-02-27 DIAGNOSIS — O4703 False labor before 37 completed weeks of gestation, third trimester: Secondary | ICD-10-CM

## 2015-02-27 LAB — URINALYSIS, ROUTINE W REFLEX MICROSCOPIC
Bilirubin Urine: NEGATIVE
Glucose, UA: NEGATIVE mg/dL
Hgb urine dipstick: NEGATIVE
KETONES UR: NEGATIVE mg/dL
NITRITE: NEGATIVE
PH: 6 (ref 5.0–8.0)
PROTEIN: NEGATIVE mg/dL
Specific Gravity, Urine: <1.005 — ABNORMAL LOW (ref 1.005–1.030)
Urobilinogen, UA: 0.2 mg/dL (ref 0.0–1.0)

## 2015-02-27 LAB — URINE MICROSCOPIC-ADD ON

## 2015-02-27 NOTE — MAU Note (Signed)
Contractions since 2100. For repeat C.S July 22. Denies LOF or bleeding

## 2015-02-27 NOTE — MAU Provider Note (Signed)
History     CSN: [id number]  Arrival date and time: 02/27/15 2246   First Provider Initiated Contact with Patient 02/27/15 2330      Chief Complaint  Patient presents with  . Contractions   HPI [name] 24 y.o. [id number]  presents to MAU complaining of contractions and pelvic pressure.  This started at 9pm.  No unusual activity but has been lifting [age] year old child.  Is feeling good fetal movement.  Denies LOF, dysuria, VB.  She notes this happened a week ago and she got meds that made it go away.  Today, it is worse, with more pelvic pressure.   OB History    Gravida Para Term Preterm AB TAB SAB Ectopic Multiple Living   Obstetric Comments   Baby with gastroschesis      Past Medical History  Diagnosis Date  . No pertinent past medical history   . Medical history non-contributory   . S/P cesarean section 12/13/2013    Past Surgical History  Procedure Laterality Date  . Cesarean section      Emergent  . Cesarean section    . Laparoscopic appendectomy N/A 05/18/2013    Procedure: APPENDECTOMY LAPAROSCOPIC;  Surgeon: Atilano Ina, MD;  Location: [facility] OR;  Service: General;  Laterality: N/A;  . Cesarean section N/A 12/13/2013    Procedure: REPEAT CESAREAN SECTION;  Surgeon: Sherian Rein, MD;  Location: [facility] ORS;  Service: Obstetrics;  Laterality: N/A;    Family History  Problem Relation Age of Onset  . Other Neg Hx     History  Substance Use Topics  . Smoking status: Never Smoker   . Smokeless tobacco: Never Used  . Alcohol Use: No    Allergies: No Known Allergies  Prescriptions prior to admission  Medication Sig Dispense Refill Last Dose  . Prenatal Vit-Fe Fumarate-FA (PRENATAL MULTIVITAMIN) TABS tablet Take 1 tablet by mouth daily at 12 noon. 30 tablet 12 02/16/2015 at Unknown time    ROS Pertinent ROS in HPI.  All other systems are negative.   Physical Exam   Blood pressure 115/57, pulse 84, temperature 97.5 F (36.4  C), resp. rate 18, height  (1.575 m), weight 177 lb (80.287 kg), unknown if currently breastfeeding.  Physical Exam  Constitutional: She is oriented to person, place, and time. She appears well-developed and well-nourished. No distress.  HENT:  Head: Normocephalic and atraumatic.  Eyes: EOM are normal.  Neck: Normal range of motion.  Cardiovascular: Normal rate, regular rhythm and normal heart sounds.   Respiratory: Effort normal and breath sounds normal. No respiratory distress.  GI: Soft. Bowel sounds are normal. She exhibits no distension. There is no tenderness.  Genitourinary:  Cervix is closed  Musculoskeletal: Normal range of motion.  Neurological: She is alert and oriented to person, place, and time.  Skin: Skin is warm and dry.  Psychiatric: She has a normal mood and affect.   Fetal Tracing: Baseline:135 Variability:mod Accelerations: 15x15 Decelerations:none Toco: irregular q 2-5 minutes initially   Results for orders placed or performed during the hospital encounter of 02/27/15 (from the past 48 hour(s))  Urinalysis, Routine w reflex microscopic (not at [facility])     Status: Abnormal   Collection Time: 02/27/15 11:00 PM  Result Value Ref Range   Color, Urine YELLOW YELLOW   APPearance CLEAR CLEAR   Specific Gravity, Urine <1.005 (L) 1.005 - 1.030   pH  6.0 5.0 - 8.0   Glucose, UA NEGATIVE NEGATIVE mg/dL   Hgb urine dipstick NEGATIVE NEGATIVE   Bilirubin Urine NEGATIVE NEGATIVE   Ketones, ur NEGATIVE NEGATIVE mg/dL   Protein, ur NEGATIVE NEGATIVE mg/dL   Urobilinogen, UA 0.2 0.0 - 1.0 mg/dL   Nitrite NEGATIVE NEGATIVE   Leukocytes, UA TRACE (A) NEGATIVE  Urine microscopic-add on     Status: Abnormal   Collection Time: 02/27/15 11:00 PM  Result Value Ref Range   Squamous Epithelial / LPF FEW (A) RARE   WBC, UA 0-2 <3 WBC/hpf   Bacteria, UA RARE RARE   MAU Course  Procedures  MDM Discussed with Dr. Ambrose Mantle.  He advises for Terbutyline which has previously  been effective tocolytic for pt.   On arrival of medication, pt indicates improvement in her contractions.  She declines terb and is requesting discharge.  Toco monitor supports improvement in ctx.   Dr. Ambrose Mantle again consulted and he is in agreement for discharge.    Assessment and Plan  A:  1. Threatened preterm labor, third trimester    P: Discharge to home Encourage po fluids F/u in clinic as scheduled/prn Patient may return to MAU as needed or if her condition were to change or worsen   Bertram Denver 02/27/2015, 11:31 PM

## 2015-02-28 DIAGNOSIS — O4703 False labor before 37 completed weeks of gestation, third trimester: Secondary | ICD-10-CM | POA: Diagnosis not present

## 2015-02-28 LAB — FETAL FIBRONECTIN: Fetal Fibronectin: NEGATIVE

## 2015-02-28 MED ORDER — TERBUTALINE SULFATE 1 MG/ML IJ SOLN
0.2500 mg | Freq: Once | INTRAMUSCULAR | Status: DC
  Filled 2015-02-28: qty 1

## 2015-02-28 NOTE — Discharge Instructions (Signed)

## 2015-04-01 ENCOUNTER — Encounter (HOSPITAL_COMMUNITY)
Admission: RE | Admit: 2015-04-01 | Discharge: 2015-04-01 | Disposition: A | Discharge status: Home / Self Care | Payer: Medicaid Other | Source: Ambulatory Visit | Attending: Obstetrics and Gynecology | Admitting: Obstetrics and Gynecology

## 2015-04-01 ENCOUNTER — Encounter (HOSPITAL_COMMUNITY): Payer: Self-pay

## 2015-04-01 DIAGNOSIS — Z01818 Encounter for other preprocedural examination: Secondary | ICD-10-CM | POA: Diagnosis present

## 2015-04-01 HISTORY — DX: Anxiety disorder, unspecified: F41.9

## 2015-04-01 LAB — CBC
HCT: 34 % — ABNORMAL LOW (ref 36.0–46.0)
Hemoglobin: 11.2 g/dL — ABNORMAL LOW (ref 12.0–15.0)
MCH: 28.1 pg (ref 26.0–34.0)
MCHC: 32.9 g/dL (ref 30.0–36.0)
MCV: 85.4 fL (ref 78.0–100.0)
PLATELETS: 202 10*3/uL (ref 150–400)
RBC: 3.98 MIL/uL (ref 3.87–5.11)
RDW: 14.5 % (ref 11.5–15.5)
WBC: 9.3 10*3/uL (ref 4.0–10.5)

## 2015-04-01 LAB — BASIC METABOLIC PANEL
ANION GAP: 5 (ref 5–15)
BUN: 5 mg/dL — ABNORMAL LOW (ref 6–20)
CHLORIDE: 106 mmol/L (ref 101–111)
CO2: 21 mmol/L — ABNORMAL LOW (ref 22–32)
Calcium: 8.3 mg/dL — ABNORMAL LOW (ref 8.9–10.3)
Creatinine, Ser: 0.51 mg/dL (ref 0.44–1.00)
GFR calc Af Amer: >60 mL/min (ref >60)
GFR calc non Af Amer: >60 mL/min (ref >60)
Glucose, Bld: 136 mg/dL — ABNORMAL HIGH (ref 65–99)
POTASSIUM: 3.4 mmol/L — AB (ref 3.5–5.1)
Sodium: 132 mmol/L — ABNORMAL LOW (ref 135–145)

## 2015-04-01 LAB — TYPE AND SCREEN
ABO/RH(D): A POS
Antibody Screen: NEGATIVE

## 2015-04-01 NOTE — Patient Instructions (Signed)
Your procedure is scheduled on:  April 03, 2015    Enter through the Main [facility]tr[facility] of [facility] at:  7:00 am   Pick up the phone at the desk and dial [phone].  Call this number if you have problems the morning of surgery: [phone].  Remember: Do NOT eat food:  After midnight on Thursday  Do NOT drink clear liquids after:  Midnight on Thursday  Take these medicines the morning of surgery with a SIP OF WATER:  None   Do NOT wear jewelry (body piercing), metal hair clips/bobby pins,  or nail polish. Do NOT wear lotions, powders, or perfumes.  You may wear deoderant. Do NOT shave for 48 hours prior to surgery. Do NOT bring valuables to the hospital. Leave suitcase in car.  After surgery it may be brought to your room.  For patients admitted to the hospital, checkout time is 11:00 AM the day of discharge.

## 2015-04-02 ENCOUNTER — Encounter (HOSPITAL_COMMUNITY): Payer: Self-pay | Admitting: Obstetrics and Gynecology

## 2015-04-02 DIAGNOSIS — Z3493 Encounter for supervision of normal pregnancy, unspecified, third trimester: Secondary | ICD-10-CM

## 2015-04-02 HISTORY — DX: Encounter for supervision of normal pregnancy, unspecified, third trimester: Z34.93

## 2015-04-02 LAB — RPR: RPR: NONREACTIVE

## 2015-04-02 NOTE — H&P (Signed)
[name] is a 24 y.o. female [phone] at 39+ for rLTCS.  Largely uncomplicated PNC.  H/o LTCS x 2, d/w pt r/b/a of LTCS.    Maternal Medical History:  Contractions: Frequency: irregular.    Fetal activity: Perceived fetal activity is normal.    Prenatal Complications - Diabetes: none.    OB History    Gravida Para Term Preterm AB TAB SAB Ectopic Multiple Living   Obstetric Comments   Baby with gastroschesis    G1 35wk LTCS - gastroschisis G2 SAB G3 39 wk LTCS, appy at 9 wk G4 present No abn pap, h/o Chl, s/p gardasil Past Medical History  Diagnosis Date  . No pertinent past medical history   . Medical history non-contributory   . S/P cesarean section 12/13/2013    patient has had 2 previous c/s   . Anxiety   . Normal pregnancy in third trimester 04/02/2015   Past Surgical History  Procedure Laterality Date  . Cesarean section      Emergent  . Cesarean section    . Laparoscopic appendectomy N/A 05/18/2013    Procedure: APPENDECTOMY LAPAROSCOPIC;  Surgeon: Atilano Ina, MD;  Location: [facility] OR;  Service: General;  Laterality: N/A;  . Cesarean section N/A 12/13/2013    Procedure: REPEAT CESAREAN SECTION;  Surgeon: Sherian Rein, MD;  Location: [facility] ORS;  Service: Obstetrics;  Laterality: N/A;  . Appendectomy     Family History: DM, gastroschisis Social History:  reports that she has never smoked. She has never used smokeless tobacco. She reports that she does not drink alcohol or use illicit drugs.married, SAHM Meds PNV All NKDA   Prenatal Transfer Tool  Maternal Diabetes: No Genetic Screening: Normal Maternal Ultrasounds/Referrals: Normal Fetal Ultrasounds or other Referrals:  None Maternal Substance Abuse:  No Significant Maternal Medications:  None Significant Maternal Lab Results:  Lab values include: Group B Strep negative Other Comments:  None  Review of Systems  Constitutional: Negative.   HENT: Negative.   Eyes: Negative.    Respiratory: Negative.   Cardiovascular: Negative.   Gastrointestinal: Negative.   Genitourinary: Negative.   Musculoskeletal: Negative.   Skin: Negative.   Neurological: Negative.   Psychiatric/Behavioral: Negative.       not currently breastfeeding. Maternal Exam:  Uterine Assessment: Contraction frequency is irregular.   Abdomen: Patient reports no abdominal tenderness. Surgical scars: low transverse.   Fundal height is appropriate for gestation.   Estimated fetal weight is 7-8#.   Fetal presentation: vertex  Introitus: Normal vulva. Normal vagina.    Physical Exam  Constitutional: She is oriented to person, place, and time. She appears well-developed and well-nourished.  HENT:  Head: Normocephalic and atraumatic.  Cardiovascular: Normal rate and regular rhythm.   Respiratory: Effort normal and breath sounds normal. No respiratory distress. She has no wheezes.  GI: Soft. Bowel sounds are normal. She exhibits no distension. There is no tenderness.  Musculoskeletal: Normal range of motion.  Neurological: She is alert and oriented to person, place, and time.  Skin: Skin is warm and dry.  Psychiatric: She has a normal mood and affect. Her behavior is normal.    Prenatal labs: ABO, Rh: --/--/A POS (07/20 1600) Antibody: NEG (07/20 1600) Rubella: Immune (01/26 0000) RPR: Non Reactive (07/20 1600)  HBsAg: Negative (01/26 0000)  HIV: Non-reactive (01/26 0000)  GBS:   negative  Hgb 12.2/Plt 240K/Ur Cx neg/ GC neg/ Chl neg/Pap  WNL/ preg dated by LMP cw 1 st tri [place] nl anat, ant plac, female Nl first tri screen/glucola 132 CF neg Tdap 01/14/15   Assessment/Plan: 24yo [id number] at 39 wk for rLTCS D/w pt r/b/a of LTCS, wishes to proceed gbbs neg Ancef for prophylaxis  Bovard-Stuckert, Wyatt Galvan 04/02/2015, 6:05 PM

## 2015-04-03 ENCOUNTER — Inpatient Hospital Stay (HOSPITAL_COMMUNITY): Payer: Medicaid Other | Admitting: Anesthesiology

## 2015-04-03 ENCOUNTER — Inpatient Hospital Stay (HOSPITAL_COMMUNITY)
Admission: RE | Admit: 2015-04-03 | Discharge: 2015-04-05 | DRG: 765 | Disposition: A | Discharge status: Home / Self Care | Payer: Medicaid Other | Source: Ambulatory Visit | Attending: Obstetrics and Gynecology | Admitting: Obstetrics and Gynecology

## 2015-04-03 ENCOUNTER — Encounter (HOSPITAL_COMMUNITY): Payer: Self-pay | Admitting: Anesthesiology

## 2015-04-03 ENCOUNTER — Encounter (HOSPITAL_COMMUNITY)
Admission: RE | Disposition: A | Discharge status: Home / Self Care | Payer: Self-pay | Source: Ambulatory Visit | Attending: Obstetrics and Gynecology

## 2015-04-03 DIAGNOSIS — Z302 Encounter for sterilization: Secondary | ICD-10-CM

## 2015-04-03 DIAGNOSIS — Z833 Family history of diabetes mellitus: Secondary | ICD-10-CM

## 2015-04-03 DIAGNOSIS — Z98891 History of uterine scar from previous surgery: Secondary | ICD-10-CM

## 2015-04-03 DIAGNOSIS — O3421 Maternal care for scar from previous cesarean delivery: Principal | ICD-10-CM | POA: Diagnosis present

## 2015-04-03 DIAGNOSIS — Z3A39 39 weeks gestation of pregnancy: Secondary | ICD-10-CM | POA: Diagnosis present

## 2015-04-03 DIAGNOSIS — N9981 Other intraoperative complications of genitourinary system: Secondary | ICD-10-CM | POA: Diagnosis not present

## 2015-04-03 DIAGNOSIS — Z9851 Tubal ligation status: Secondary | ICD-10-CM

## 2015-04-03 DIAGNOSIS — Z3493 Encounter for supervision of normal pregnancy, unspecified, third trimester: Secondary | ICD-10-CM

## 2015-04-03 DIAGNOSIS — Z9889 Other specified postprocedural states: Secondary | ICD-10-CM

## 2015-04-03 DIAGNOSIS — Z3490 Encounter for supervision of normal pregnancy, unspecified, unspecified trimester: Secondary | ICD-10-CM

## 2015-04-03 HISTORY — PX: CYSTORRHAPHY: SUR367

## 2015-04-03 HISTORY — PX: BLADDER NECK RECONSTRUCTION: SHX1239

## 2015-04-03 HISTORY — DX: Tubal ligation status: Z98.51

## 2015-04-03 HISTORY — DX: Encounter for supervision of normal pregnancy, unspecified, third trimester: Z34.93

## 2015-04-03 HISTORY — DX: Other specified postprocedural states: Z98.890

## 2015-04-03 SURGERY — Surgical Case
Anesthesia: Spinal | Site: Bladder

## 2015-04-03 MED ORDER — ONDANSETRON HCL 4 MG/2ML IJ SOLN
INTRAMUSCULAR | Status: DC | PRN
  Administered 2015-04-03: 4 mg via INTRAVENOUS

## 2015-04-03 MED ORDER — DIPHENHYDRAMINE HCL 50 MG/ML IJ SOLN: 12.5000 mg | INTRAMUSCULAR | Status: DC | PRN

## 2015-04-03 MED ORDER — BUPIVACAINE IN DEXTROSE 0.75-8.25 % IT SOLN
INTRATHECAL | Status: DC | PRN
  Administered 2015-04-03: 1.5 mL via INTRATHECAL

## 2015-04-03 MED ORDER — PRENATAL MULTIVITAMIN CH
1.0000 | ORAL_TABLET | Freq: Every day | ORAL | Status: DC
  Administered 2015-04-04: 1 via ORAL
  Filled 2015-04-03: qty 1

## 2015-04-03 MED ORDER — SIMETHICONE 80 MG PO CHEW
80.0000 mg | CHEWABLE_TABLET | Freq: Three times a day (TID) | ORAL | Status: DC
Start: 2015-04-03 — End: 2015-04-05
  Administered 2015-04-03 – 2015-04-04 (×3): 80 mg via ORAL
  Filled 2015-04-03 (×4): qty 1

## 2015-04-03 MED ORDER — MENTHOL 3 MG MT LOZG: 1.0000 | LOZENGE | OROMUCOSAL | Status: DC | PRN

## 2015-04-03 MED ORDER — LACTATED RINGERS IV SOLN: INTRAVENOUS | Status: DC

## 2015-04-03 MED ORDER — HYDROMORPHONE HCL 1 MG/ML IJ SOLN: 0.2500 mg | INTRAMUSCULAR | Status: DC | PRN

## 2015-04-03 MED ORDER — ZOLPIDEM TARTRATE 5 MG PO TABS: 5.0000 mg | ORAL_TABLET | Freq: Every evening | ORAL | Status: DC | PRN

## 2015-04-03 MED ORDER — LANOLIN HYDROUS EX OINT: 1.0000 "application " | TOPICAL_OINTMENT | CUTANEOUS | Status: DC | PRN

## 2015-04-03 MED ORDER — OXYTOCIN 40 UNITS IN LACTATED RINGERS INFUSION - SIMPLE MED: 62.5000 mL/h | INTRAVENOUS | Status: AC

## 2015-04-03 MED ORDER — IBUPROFEN 800 MG PO TABS
800.0000 mg | ORAL_TABLET | Freq: Three times a day (TID) | ORAL | Status: DC
  Administered 2015-04-04 – 2015-04-05 (×5): 800 mg via ORAL
  Filled 2015-04-03 (×5): qty 1

## 2015-04-03 MED ORDER — OXYCODONE-ACETAMINOPHEN 5-325 MG PO TABS
1.0000 | ORAL_TABLET | ORAL | Status: DC | PRN
  Administered 2015-04-03: 1 via ORAL
  Filled 2015-04-03: qty 1

## 2015-04-03 MED ORDER — SIMETHICONE 80 MG PO CHEW
80.0000 mg | CHEWABLE_TABLET | ORAL | Status: DC | PRN
  Administered 2015-04-04: 80 mg via ORAL

## 2015-04-03 MED ORDER — NALBUPHINE HCL 10 MG/ML IJ SOLN: 5.0000 mg | INTRAMUSCULAR | Status: DC | PRN

## 2015-04-03 MED ORDER — ACETAMINOPHEN 500 MG PO TABS
1000.0000 mg | ORAL_TABLET | Freq: Four times a day (QID) | ORAL | Status: AC
  Administered 2015-04-03: 1000 mg via ORAL
  Filled 2015-04-03: qty 2

## 2015-04-03 MED ORDER — MORPHINE SULFATE (PF) 0.5 MG/ML IJ SOLN
INTRAMUSCULAR | Status: DC | PRN
  Administered 2015-04-03: .2 mg via INTRATHECAL

## 2015-04-03 MED ORDER — MEPERIDINE HCL 25 MG/ML IJ SOLN: 6.2500 mg | INTRAMUSCULAR | Status: DC | PRN

## 2015-04-03 MED ORDER — DIPHENHYDRAMINE HCL 25 MG PO CAPS: 25.0000 mg | ORAL_CAPSULE | Freq: Four times a day (QID) | ORAL | Status: DC | PRN

## 2015-04-03 MED ORDER — LACTATED RINGERS IV SOLN
INTRAVENOUS | Status: DC
  Administered 2015-04-03 (×3): via INTRAVENOUS

## 2015-04-03 MED ORDER — KETOROLAC TROMETHAMINE 30 MG/ML IJ SOLN
30.0000 mg | Freq: Four times a day (QID) | INTRAMUSCULAR | Status: AC | PRN
  Administered 2015-04-03: 30 mg via INTRAMUSCULAR
  Filled 2015-04-03: qty 1

## 2015-04-03 MED ORDER — NALBUPHINE HCL 10 MG/ML IJ SOLN
INTRAMUSCULAR | Status: AC
  Filled 2015-04-03: qty 1

## 2015-04-03 MED ORDER — DIPHENHYDRAMINE HCL 25 MG PO CAPS: 25.0000 mg | ORAL_CAPSULE | ORAL | Status: DC | PRN

## 2015-04-03 MED ORDER — ONDANSETRON HCL 4 MG/2ML IJ SOLN: 4.0000 mg | Freq: Three times a day (TID) | INTRAMUSCULAR | Status: DC | PRN

## 2015-04-03 MED ORDER — SIMETHICONE 80 MG PO CHEW
80.0000 mg | CHEWABLE_TABLET | ORAL | Status: DC
  Administered 2015-04-04 (×2): 80 mg via ORAL
  Filled 2015-04-03 (×2): qty 1

## 2015-04-03 MED ORDER — ACETAMINOPHEN 325 MG PO TABS: 650.0000 mg | ORAL_TABLET | ORAL | Status: DC | PRN

## 2015-04-03 MED ORDER — CEFAZOLIN SODIUM-DEXTROSE 2-3 GM-% IV SOLR
INTRAVENOUS | Status: AC
  Filled 2015-04-03: qty 50

## 2015-04-03 MED ORDER — PROMETHAZINE HCL 25 MG/ML IJ SOLN: 6.2500 mg | INTRAMUSCULAR | Status: DC | PRN

## 2015-04-03 MED ORDER — KETOROLAC TROMETHAMINE 30 MG/ML IJ SOLN
INTRAMUSCULAR | Status: AC
  Filled 2015-04-03: qty 1

## 2015-04-03 MED ORDER — SCOPOLAMINE 1 MG/3DAYS TD PT72
1.0000 | MEDICATED_PATCH | Freq: Once | TRANSDERMAL | Status: DC
  Administered 2015-04-03: 1.5 mg via TRANSDERMAL

## 2015-04-03 MED ORDER — IBUPROFEN 600 MG PO TABS: 600.0000 mg | ORAL_TABLET | Freq: Four times a day (QID) | ORAL | Status: DC | PRN

## 2015-04-03 MED ORDER — CEFAZOLIN SODIUM-DEXTROSE 2-3 GM-% IV SOLR
2.0000 g | INTRAVENOUS | Status: AC
  Administered 2015-04-03: 2 g via INTRAVENOUS

## 2015-04-03 MED ORDER — LACTATED RINGERS IV SOLN
INTRAVENOUS | Status: DC | PRN
  Administered 2015-04-03: 09:00:00 via INTRAVENOUS

## 2015-04-03 MED ORDER — KETOROLAC TROMETHAMINE 30 MG/ML IJ SOLN: 30.0000 mg | Freq: Once | INTRAMUSCULAR | Status: DC | PRN

## 2015-04-03 MED ORDER — FAMOTIDINE 20 MG PO TABS
20.0000 mg | ORAL_TABLET | Freq: Every day | ORAL | Status: DC
  Administered 2015-04-03 – 2015-04-04 (×2): 20 mg via ORAL
  Filled 2015-04-03 (×2): qty 1

## 2015-04-03 MED ORDER — LACTATED RINGERS IV SOLN
Freq: Once | INTRAVENOUS | Status: AC
  Administered 2015-04-03: 07:00:00 via INTRAVENOUS

## 2015-04-03 MED ORDER — NALBUPHINE HCL 10 MG/ML IJ SOLN
5.0000 mg | Freq: Once | INTRAMUSCULAR | Status: AC | PRN
  Administered 2015-04-03: 5 mg via SUBCUTANEOUS

## 2015-04-03 MED ORDER — DEXAMETHASONE SODIUM PHOSPHATE 10 MG/ML IJ SOLN
INTRAMUSCULAR | Status: DC | PRN
  Administered 2015-04-03: 4 mg via INTRAVENOUS

## 2015-04-03 MED ORDER — OXYTOCIN 10 UNIT/ML IJ SOLN
40.0000 [IU] | INTRAVENOUS | Status: DC | PRN
  Administered 2015-04-03: 40 [IU] via INTRAVENOUS

## 2015-04-03 MED ORDER — SODIUM CHLORIDE 0.9 % IJ SOLN: 3.0000 mL | INTRAMUSCULAR | Status: DC | PRN

## 2015-04-03 MED ORDER — NALOXONE HCL 0.4 MG/ML IJ SOLN: 0.4000 mg | INTRAMUSCULAR | Status: DC | PRN

## 2015-04-03 MED ORDER — DIBUCAINE 1 % RE OINT: 1.0000 "application " | TOPICAL_OINTMENT | RECTAL | Status: DC | PRN

## 2015-04-03 MED ORDER — KETOROLAC TROMETHAMINE 30 MG/ML IJ SOLN
30.0000 mg | Freq: Four times a day (QID) | INTRAMUSCULAR | Status: AC | PRN
  Administered 2015-04-03: 30 mg via INTRAVENOUS
  Filled 2015-04-03: qty 1

## 2015-04-03 MED ORDER — NALOXONE HCL 1 MG/ML IJ SOLN
1.0000 ug/kg/h | INTRAVENOUS | Status: DC | PRN
  Filled 2015-04-03: qty 2

## 2015-04-03 MED ORDER — LACTATED RINGERS IV SOLN
INTRAVENOUS | Status: DC
  Administered 2015-04-03: 20:00:00 via INTRAVENOUS

## 2015-04-03 MED ORDER — SENNOSIDES-DOCUSATE SODIUM 8.6-50 MG PO TABS
2.0000 | ORAL_TABLET | ORAL | Status: DC
  Administered 2015-04-04 (×2): 2 via ORAL
  Filled 2015-04-03 (×2): qty 2

## 2015-04-03 MED ORDER — SCOPOLAMINE 1 MG/3DAYS TD PT72
MEDICATED_PATCH | TRANSDERMAL | Status: AC
  Administered 2015-04-03: 1.5 mg via TRANSDERMAL
  Filled 2015-04-03: qty 1

## 2015-04-03 MED ORDER — 0.9 % SODIUM CHLORIDE (POUR BTL) OPTIME
TOPICAL | Status: DC | PRN
  Administered 2015-04-03: 1000 mL

## 2015-04-03 MED ORDER — WITCH HAZEL-GLYCERIN EX PADS: 1.0000 "application " | MEDICATED_PAD | CUTANEOUS | Status: DC | PRN

## 2015-04-03 MED ORDER — PHENYLEPHRINE 8 MG IN D5W 100 ML (0.08MG/ML) PREMIX OPTIME
INJECTION | INTRAVENOUS | Status: DC | PRN
  Administered 2015-04-03: 60 ug/min via INTRAVENOUS

## 2015-04-03 MED ORDER — OXYCODONE-ACETAMINOPHEN 5-325 MG PO TABS
2.0000 | ORAL_TABLET | ORAL | Status: DC | PRN
  Administered 2015-04-04 – 2015-04-05 (×8): 2 via ORAL
  Filled 2015-04-03 (×8): qty 2

## 2015-04-03 MED ORDER — FENTANYL CITRATE (PF) 100 MCG/2ML IJ SOLN
INTRAMUSCULAR | Status: DC | PRN
  Administered 2015-04-03: 12.5 ug via INTRATHECAL

## 2015-04-03 MED ORDER — NALBUPHINE HCL 10 MG/ML IJ SOLN: 5.0000 mg | Freq: Once | INTRAMUSCULAR | Status: AC | PRN

## 2015-04-03 SURGICAL SUPPLY — 41 items
APL SKNCLS STERI-STRIP NONHPOA (GAUZE/BANDAGES/DRESSINGS) ×2
BENZOIN TINCTURE PRP APPL 2/3 (GAUZE/BANDAGES/DRESSINGS) ×4 IMPLANT
CATH FOLEY 2WAY SLVR  5CC 18FR (CATHETERS) ×2
CATH FOLEY 2WAY SLVR 5CC 18FR (CATHETERS) IMPLANT
CLAMP CORD UMBIL (MISCELLANEOUS) ×2 IMPLANT
CLIP FILSHIE TUBAL LIGA STRL (Clip) ×2 IMPLANT
CLOSURE WOUND 1/2 X4 (GAUZE/BANDAGES/DRESSINGS) ×1
CLOTH BEACON ORANGE TIMEOUT ST (SAFETY) ×4 IMPLANT
CONTAINER PREFILL 10% NBF 15ML (MISCELLANEOUS) ×2 IMPLANT
DRAIN JACKSON PRT FLT 7MM (DRAIN) ×2 IMPLANT
DRAPE SHEET LG 3/4 BI-LAMINATE (DRAPES) ×2 IMPLANT
DRSG OPSITE POSTOP 4X10 (GAUZE/BANDAGES/DRESSINGS) ×4 IMPLANT
DURAPREP 26ML APPLICATOR (WOUND CARE) ×4 IMPLANT
ELECT REM PT RETURN 9FT ADLT (ELECTROSURGICAL) ×4
ELECTRODE REM PT RTRN 9FT ADLT (ELECTROSURGICAL) ×2 IMPLANT
EVACUATOR SILICONE 100CC (DRAIN) ×2 IMPLANT
EXTRACTOR VACUUM M CUP 4 TUBE (SUCTIONS) ×1 IMPLANT
EXTRACTOR VACUUM M CUP 4' TUBE (SUCTIONS) ×1
GLOVE BIO SURGEON STRL SZ 6.5 (GLOVE) ×3 IMPLANT
GLOVE BIO SURGEONS STRL SZ 6.5 (GLOVE) ×1
GOWN STRL REUS W/TWL LRG LVL3 (GOWN DISPOSABLE) ×8 IMPLANT
NS IRRIG 1000ML POUR BTL (IV SOLUTION) ×4 IMPLANT
PACK C SECTION WH (CUSTOM PROCEDURE TRAY) ×4 IMPLANT
PAD OB MATERNITY 4.3X12.25 (PERSONAL CARE ITEMS) ×4 IMPLANT
RTRCTR C-SECT PINK 25CM LRG (MISCELLANEOUS) ×4 IMPLANT
SPONGE DRAIN TRACH 4X4 STRL 2S (GAUZE/BANDAGES/DRESSINGS) ×2 IMPLANT
STRIP CLOSURE SKIN 1/2X4 (GAUZE/BANDAGES/DRESSINGS) ×3 IMPLANT
SUT CHROMIC 2 0 CT 1 (SUTURE) ×4 IMPLANT
SUT MNCRL 0 VIOLET CTX 36 (SUTURE) ×4 IMPLANT
SUT MONOCRYL 0 CTX 36 (SUTURE) ×4
SUT PLAIN 1 NONE 54 (SUTURE) ×2 IMPLANT
SUT PLAIN 2 0 XLH (SUTURE) ×4 IMPLANT
SUT SILK 2 0 SH (SUTURE) ×2 IMPLANT
SUT VIC AB 0 CT1 27 (SUTURE) ×8
SUT VIC AB 0 CT1 27XBRD ANBCTR (SUTURE) ×4 IMPLANT
SUT VIC AB 2-0 CT1 27 (SUTURE) ×4
SUT VIC AB 2-0 CT1 TAPERPNT 27 (SUTURE) ×2 IMPLANT
SUT VIC AB 4-0 KS 27 (SUTURE) ×4 IMPLANT
SYR BULB IRRIGATION 50ML (SYRINGE) ×4 IMPLANT
TOWEL OR 17X24 6PK STRL BLUE (TOWEL DISPOSABLE) ×4 IMPLANT
TRAY FOLEY CATH SILVER 14FR (SET/KITS/TRAYS/PACK) ×2 IMPLANT

## 2015-04-03 NOTE — Progress Notes (Signed)
MOB was referred for history of depression/anxiety.  Referral is screened out by Clinical Social Worker because none of the following criteria appear to apply: -History of anxiety/depression during this pregnancy, or of post-partum depression. - Diagnosis of anxiety and/or depression within last 3 years or -MOB's symptoms are currently being treated with medication and/or therapy.  Per medical records, MOB was previously prescribed Zoloft, symptoms last treated in 2011.  Please contact the Clinical Social Worker if needs arise or upon MOB request.  

## 2015-04-03 NOTE — Anesthesia Preprocedure Evaluation (Addendum)
Anesthesia Evaluation  Patient identified by MRN, date of birth, ID band Patient awake    Reviewed: Allergy & Precautions, H&P , NPO status , Patient's Chart, lab work & pertinent test results  Airway Mallampati: I  TM Distance: >3 FB Neck ROM: full    Dental no notable dental hx.    Pulmonary neg pulmonary ROS,    Pulmonary exam normal        Cardiovascular negative cardio ROS Normal cardiovascular exam     Neuro/Psych negative neurological ROS     GI/Hepatic negative GI ROS, Neg liver ROS,   Endo/Other  negative endocrine ROS  Renal/GU negative Renal ROS     Musculoskeletal   Abdominal Normal abdominal exam  (+)   Peds  Hematology negative hematology ROS (+)   Anesthesia Other Findings   Reproductive/Obstetrics (+) Pregnancy                             Anesthesia Physical Anesthesia Plan  ASA: II  Anesthesia Plan: Spinal   Post-op Pain Management:    Induction:   Airway Management Planned:   Additional Equipment:   Intra-op Plan:   Post-operative Plan:   Informed Consent: I have reviewed the patients History and Physical, chart, labs and discussed the procedure including the risks, benefits and alternatives for the proposed anesthesia with the patient or authorized representative who has indicated his/her understanding and acceptance.     Plan Discussed with: CRNA and Surgeon  Anesthesia Plan Comments:         Anesthesia Quick Evaluation  

## 2015-04-03 NOTE — Transfer of Care (Signed)
Immediate Anesthesia Transfer of Care Note  Patient: [name]  Procedure(s) Performed: Procedure(s) with comments: REPEAT CESAREAN SECTION WITH BILATERAL TUBAL LIGATION (Bilateral) - MD requests RNFA Jacqulyn Liner. RNFA confirmed 03/06/2015 REPAIR BLADDER LACERATION (N/A)  Patient Location: PACU  Anesthesia Type:Spinal  Level of Consciousness: awake, alert  and oriented  Airway & Oxygen Therapy: Patient Spontanous Breathing  Post-op Assessment: Report given to RN and Post -op Vital signs reviewed and stable  Post vital signs: Reviewed and stable  Last Vitals:  Filed Vitals:   04/03/15 0704  BP: 100/65  Pulse: 95  Temp: 36.8 C  Resp: 18    Complications: No apparent anesthesia complications

## 2015-04-03 NOTE — Progress Notes (Signed)
UR chart review completed.  

## 2015-04-03 NOTE — Anesthesia Procedure Notes (Signed)
Spinal Patient location during procedure: OR Start time: 04/03/2015 8:32 AM End time: 04/03/2015 8:36 AM Staffing Anesthesiologist: Leilani Able Performed by: anesthesiologist  Preanesthetic Checklist Completed: patient identified, surgical consent, pre-op evaluation, timeout performed, IV checked, risks and benefits discussed and monitors and equipment checked Spinal Block Patient position: sitting Prep: site prepped and draped and DuraPrep Patient monitoring: heart rate, cardiac monitor, continuous pulse ox and blood pressure Approach: midline Location: L3-4 Injection technique: single-shot Needle Needle type: Pencan  Needle gauge: 24 G Needle length: 9 cm Needle insertion depth: 6 cm Assessment Sensory level: T4

## 2015-04-03 NOTE — Op Note (Signed)
[name], [name]            ACCOUNT NO.:  [id number]  MEDICAL RECORD NO.:  [id number]  LOCATION:  9109                          FACILITY:  [facility]  PHYSICIAN:  Sherron Monday, MD        DATE OF BIRTH:  1991/07/15  DATE OF PROCEDURE:  04/03/2015 DATE OF DISCHARGE:                              OPERATIVE REPORT   PREOPERATIVE DIAGNOSES:  Intrauterine pregnancy at term, history of low- transverse cesarean section x2, declines vaginal birth after cesarean. Undesired fertility  POSTOPERATIVE DIAGNOSES:  Intrauterine pregnancy at term, history of low- transverse cesarean section x2, declines vaginal birth after cesarean,undesired fertility, s/p delivery and Repair of bladder laceration.  PROCEDURE:  Repeat low-transverse cesarean section with bilateral tubal ligation; left salpingectomy; right-sided Filshie clips; and repair of a bladder laceration.  SURGEON:  Sherron Monday, M.D.  ASSISTANT:  Georges Lynch, RNFA.  ASSISTANT SURGEON:  Excell Seltzer. Annabell Howells, M.D.  ANESTHESIA:  Spinal.  FINDINGS:  Viable female infant at 8:59 a.m. with Apgars of 9 at 1 minute, 9 at 5 minutes.  Weight pending at the time of dictation.  Normal uterus, tubes scarred to the right with peritoneal and bladder scarring on the right as well.  A 4-5 cm laceration to bladder dome.  EBL:  600 mL.  IV FLUIDS:  3700 mL.  URINE OUTPUT:  600 mL, bloody urine at the end of the procedure.  DRAINS PLACED:  A Jackson-Pratt with closed bulb suction to the pelvis.  SPECIMEN:  Placenta to L and D, left tube to Pathology.  DESCRIPTION OF PROCEDURE:  After informed consent was reviewed with the patient including risks, benefits, and alternatives of the surgical procedure, she was transported to the OR, placed on the table where spinal anesthesia was placed.  She was then returned to supine position with a leftward tilt.  When the level was high enough, she was prepped and draped in normal sterile fashion.  Before the start of  the procedure, a time-out was performed.  A Pfannenstiel skin incision was made at the level of her previous incision and carried through to the underlying layer of fascia sharply.  The fascia was incised in the midline.  The incision was extended with Mayo scissors.  In the process, diastasis was noted, her midline was found, her peritoneal cavity was entered.  Apparently, her bladder with a Foley was noted as well.  Entry into the peritoneal cavity was made somewhat higher than this.  Infant was delivered from a vertex presentation with the aid of a vacuum.  At this time, Urology was called.  The bladder injury was identified after the uterus was closed with 2 layers of 0 monocryl, first as a running locked and the second as an imbricating layer.  The bladder injury was identified.  The bladder was inspected by Dr. Annabell Howells and thought to be merely anterior at the dome.  The left tubes was identified and followed out to the fimbriated end, excised and doubly ligated.  Hemostatic.  R tube was identified, but was scarred, decision was made to proceed with ligation by filshie clips.  The peritoneum was closed with 2-0 Vicryl, running.    The patient tolerated the closure well.  Before closure a JP was placed.  The peritoneum was reapproximated using 2-0 Vicryl.  Subfascial planes were inspected, found to be hemostatic. Fascia was closed with 0 Vicryl in a single suture, was inspected for integrity. The JP drain had been placed through the fascia.  The subcuticular adipose layer was made hemostatic and the dead space was closed with plain gut.  The skin was closed with 4-0 Vicryl on a straight needle. Benzoin and Steri-Strips were applied.  The JP drain was sewn with 0 silk.  Drain sponge was placed and the C-section bandage was placed. The patient tolerated the procedure well.  Sponge, lap, and needle counts were correct x2.  During the procedure, the situation was discussed with the patient.   She and her husband were in understanding.     Sherron Monday, MD     JB/MEDQ  D:  04/03/2015  T:  04/03/2015  Job:  [id number]

## 2015-04-03 NOTE — Op Note (Signed)
[name], [name]            ACCOUNT NO.:  [id number]  MEDICAL RECORD NO.:  [id number]  LOCATION:  9109                          FACILITY:  [facility]  PHYSICIAN:  Excell Seltzer. Annabell Howells, M.D.    DATE OF BIRTH:  06-08-1991  DATE OF PROCEDURE:  04/03/2015 DATE OF DISCHARGE:                              OPERATIVE REPORT   PROCEDURE:  Repair of bladder injury.  PREOPERATIVE DIAGNOSIS:  Intraoperative bladder injury during cesarean section.  POSTOPERATIVE DIAGNOSIS:  Intraoperative bladder injury during cesarean section.  SURGEON:  Excell Seltzer. Annabell Howells, M.D.  ASSISTANT:  Sherron Monday, MD.  SPECIMEN:  None.  DRAINS:  A 14-French Foley catheter that would be switched to a 20- [place] and a Jackson-Pratt wound drain.  BLOOD LOSS:  Minimal.  COMPLICATIONS:  None.  INDICATIONS:  Ms. Fentress is a 24 year old white female, who is undergoing a C-section.  The bladder flap was scarred to the ears because of prior C-sections and bladder injury occurred.  I was asked to repair the bladder injury.  FINDINGS AND PROCEDURE:  The patient received antibiotics per Dr. Ellyn Hack and on my arrival, the bladder was exposed with the edges grasped by sponge sticks.  On inspection, there was approximately a 7-cm bladder laceration with somewhat ragged edges.  I inspected the bladder and found no additional injury.  The bladder injury was then repaired with two layers of running 2-0 chromic with a musculomucosal layer and a seromuscular layer.  Once the solid repair had been completed, the procedure was turned back over to Dr. Ellyn Hack.  She will place a Al Pimple drain at the end of the procedure and the 14-French Foley catheter would be exchanged for a 20-French catheter.  There were no complications during my portion of the procedure.     Excell Seltzer. Annabell Howells, M.D.    JJW/MEDQ  D:  04/03/2015  T:  04/03/2015  Job:  [id number]

## 2015-04-03 NOTE — Interval H&P Note (Signed)
History and Physical Interval Note:  04/03/2015 8:08 AM  [name]  has presented today for surgery, with the diagnosis of Repeat C/Section and Sterilization  The various methods of treatment have been discussed with the patient and family. After consideration of risks, benefits and other options for treatment, the patient has consented to  Procedure(s) with comments: REPEAT CESAREAN SECTION WITH BILATERAL TUBAL LIGATION (Bilateral) - MD requests RNFA Jacqulyn Liner. RNFA confirmed 03/06/2015 as a surgical intervention .  The patient's history has been reviewed, patient examined, no change in status, stable for surgery.  I have reviewed the patient's chart and labs.  Questions were answered to the patient's satisfaction.     Pt also desires BTL by salpingectomy - d/w pt r/b/a - will proceed  Bovard-Stuckert, Ceira Hoeschen

## 2015-04-03 NOTE — Anesthesia Postprocedure Evaluation (Signed)
Anesthesia Post Note  Patient: [name]  Procedure(s) Performed: Procedure(s) (LRB): REPEAT CESAREAN SECTION WITH BILATERAL TUBAL LIGATION (Bilateral) REPAIR BLADDER LACERATION (N/A)  Anesthesia type: Spinal  Patient location: PACU  Post pain: Pain level controlled  Post assessment: Post-op Vital signs reviewed  Last Vitals:  Filed Vitals:   04/03/15 1130  BP:   Pulse: 65  Temp: 36.4 C  Resp: 15    Post vital signs: Reviewed  Level of consciousness: awake  Complications: No apparent anesthesia complications

## 2015-04-03 NOTE — Brief Op Note (Signed)
04/03/2015  10:20 AM  PATIENT:  [name]  24 y.o. female  PRE-OPERATIVE DIAGNOSIS:  Repeat Cesarean Section and Sterilization  POST-OPERATIVE DIAGNOSIS:  Repeat Cesarean Section and Sterilization / repair to bladder laceration  PROCEDURE:  Procedure(s) with comments: REPEAT CESAREAN SECTION WITH BILATERAL TUBAL LIGATION (Bilateral) - MD requests RNFA Jacqulyn Liner. RNFA confirmed 03/06/2015 REPAIR BLADDER LACERATION (N/A)  SURGEON:  Surgeon(s) and Role:    * Dhruvi Crenshaw Bovard-Stuckert, MD - Primary    * Bjorn Pippin, MD - Assisting  ASSISTANTS: Hosie Poisson, tracie RNFA   ANESTHESIA:   spinal   FINDINGS: viable female infant at 8:59am, apgars 9/9, wt P; peritoneum and bladder scarred high on right, 4-5 cm laceration to bladder dome  EBL:  Total I/O In: 3700 [I.V.:3700] Out: 1200 [Urine:600; Blood:600]  BLOOD ADMINISTERED:none  DRAINS: (1) Jackson-Pratt drain(s) with closed bulb suction in the LLQ   LOCAL MEDICATIONS USED:  NONE  SPECIMEN:  Source of Specimen:  Placenta to L&D, Left tube to pathology  DISPOSITION OF SPECIMEN:  L&D, pathology  COUNTS:  YES  TOURNIQUET:  * No tourniquets in log *  DICTATION: .Other Dictation: Dictation Number [id number]  PLAN OF CARE: Admit to inpatient   PATIENT DISPOSITION:  PACU - hemodynamically stable.   Delay start of Pharmacological VTE agent (>24hrs) due to surgical blood loss or risk of bleeding: not applicable

## 2015-04-03 NOTE — Lactation Note (Signed)
This note was copied from the chart of [name]. Lactation Consultation Note  Experienced BF mother reports that all of her children latched well though she also reports extended exclusive pumping with them.  This baby attached and had a mouthful of breast tissue.  He suckled briefly and fell asleep.  A few swallows were heard.  He was 6 hours old and had been delivered with vacuum assistance.  Mom was post cesarean and had operative complications.  She was not well focused on learning at this time.  She reports knowing how to hand express but did not demonstrate it.  Follow-up tomorrow. Has information on support groups and outpatient services. d d Patient Name: Cindy Virjean Boman [id number] Date: 04/03/2015     Maternal Data Has patient been taught Hand Expression?: No (Reports knowing how) Does the patient have breastfeeding experience prior to this delivery?: Yes  Feeding Feeding Type: Breast Fed Length of feed: 8 min  LATCH Score/Interventions Latch: Repeated attempts needed to sustain latch, nipple held in mouth throughout feeding, stimulation needed to elicit sucking reflex. Intervention(s): Adjust position  Audible Swallowing: A few with stimulation  Type of Nipple: Everted at rest and after stimulation  Comfort (Breast/Nipple): Soft / non-tender     Hold (Positioning): Assistance needed to correctly position infant at breast and maintain latch.  LATCH Score: 7  Lactation Tools Discussed/Used     Consult Status Consult Status: Follow-up Date: 04/04/15 Follow-up type: In-patient    Soyla Dryer 04/03/2015, 4:17 PM

## 2015-04-04 DIAGNOSIS — N9981 Other intraoperative complications of genitourinary system: Secondary | ICD-10-CM | POA: Diagnosis not present

## 2015-04-04 LAB — CBC
HEMATOCRIT: 32.7 % — AB (ref 36.0–46.0)
HEMOGLOBIN: 10.5 g/dL — AB (ref 12.0–15.0)
MCH: 27.8 pg (ref 26.0–34.0)
MCHC: 32.1 g/dL (ref 30.0–36.0)
MCV: 86.5 fL (ref 78.0–100.0)
Platelets: 194 10*3/uL (ref 150–400)
RBC: 3.78 MIL/uL — ABNORMAL LOW (ref 3.87–5.11)
RDW: 14.7 % (ref 11.5–15.5)
WBC: 12.6 10*3/uL — ABNORMAL HIGH (ref 4.0–10.5)

## 2015-04-04 LAB — BIRTH TISSUE RECOVERY COLLECTION (PLACENTA DONATION): BIRTH TISSUE RECOVERY DRAW: 605

## 2015-04-04 NOTE — Assessment & Plan Note (Signed)
She is doing well with the foley and the urine is clearing. Her JP drainage is serous and minimal.   The drain can be removed and she can be discharged at the discretion of Dr. Ellyn Hack.  She will need f/u in my office in about a week for a cystogram and foley removal.

## 2015-04-04 NOTE — Addendum Note (Signed)
Addendum  created 04/04/15 0739 by Yolonda Kida, CRNA   Modules edited: Notes Section   Notes Section:  File: [id number]

## 2015-04-04 NOTE — Progress Notes (Signed)
Subjective: Postpartum Day 1: Cesarean Delivery Patient reports incisional pain and tolerating PO.    Objective: Vital signs in last 24 hours: Temp:  [97.4 F (36.3 C)-98.4 F (36.9 C)] 97.8 F (36.6 C) (07/23 0412) Pulse Rate:  [59-83] 62 (07/23 0412) Resp:  [15-20] 18 (07/23 0412) BP: (92-114)/(54-92) 99/61 mmHg (07/23 0412) SpO2:  [98 %-100 %] 98 % (07/23 0412) Drain = 240, good uop  Physical Exam:  General: alert and no distress Lochia: appropriate Uterine Fundus: firm Incision: healing well DVT Evaluation: No evidence of DVT seen on physical exam.   Recent Labs  04/01/15 1600 04/04/15 0605  HGB 11.2* 10.5*  HCT 34.0* 32.7*    Assessment/Plan: Status post Cesarean section. Doing well postoperatively.  Continue current care.  Appreciate urology input/help.    Bovard-Stuckert, Loreena Valeri 04/04/2015, 7:57 AM

## 2015-04-04 NOTE — Anesthesia Postprocedure Evaluation (Signed)
  Anesthesia Post-op Note  Patient: [name]  Procedure(s) Performed: Procedure(s) with comments: REPEAT CESAREAN SECTION WITH BILATERAL TUBAL LIGATION (Bilateral) - MD requests RNFA Jacqulyn Liner. RNFA confirmed 03/06/2015 REPAIR BLADDER LACERATION (N/A)  Patient Location: Mother/Baby  Anesthesia Type:Spinal  Level of Consciousness: awake, alert , oriented and patient cooperative  Airway and Oxygen Therapy: Patient Spontanous Breathing  Post-op Pain: none  Post-op Assessment: Post-op Vital signs reviewed, Patient's Cardiovascular Status Stable, Respiratory Function Stable, Patent Airway, No headache, No backache and Patient able to bend at knees  Post-op Vital Signs: Reviewed and stable  Last Vitals:  Filed Vitals:   04/04/15 0412  BP: 99/61  Pulse: 62  Temp: 36.6 C  Resp: 18    Complications: No apparent anesthesia complications

## 2015-04-04 NOTE — Progress Notes (Signed)
Patient ID: [name], female   DOB: 04-05-1991, 24 y.o.   MRN: [id number] 1 Day Post-Op  Subjective: She is doing well post op and the urine is clearing.  She has no significant complaints.  ROS:  Review of Systems  Constitutional: Negative for fever.  Gastrointestinal: Negative for nausea.    Anti-infectives: Anti-infectives    Start     Dose/Rate Route Frequency Ordered Stop   04/03/15 0709  ceFAZolin (ANCEF) 2-3 GM-% IVPB SOLR    Comments:  Meisinger, Lauren   : cabinet override      04/03/15 0709 04/03/15 1914   04/03/15 0649  ceFAZolin (ANCEF) IVPB 2 g/50 mL premix     2 g 100 mL/hr over 30 Minutes Intravenous On call to O.R. 04/03/15 0649 04/03/15 0830      Current Facility-Administered Medications  Medication Dose Route Frequency Provider Last Rate Last Dose  . acetaminophen (TYLENOL) tablet 650 mg  650 mg Oral Q4H PRN Sherian Rein, MD      . witch hazel-glycerin (TUCKS) pad 1 application  1 application Topical PRN Jody Bovard-Stuckert, MD       And  . dibucaine (NUPERCAINAL) 1 % rectal ointment 1 application  1 application Rectal PRN Jody Bovard-Stuckert, MD      . diphenhydrAMINE (BENADRYL) injection 12.5 mg  12.5 mg Intravenous Q4H PRN Leilani Able, MD       Or  . diphenhydrAMINE (BENADRYL) capsule 25 mg  25 mg Oral Q4H PRN Leilani Able, MD      . diphenhydrAMINE (BENADRYL) capsule 25 mg  25 mg Oral Q6H PRN Jody Bovard-Stuckert, MD      . famotidine (PEPCID) tablet 20 mg  20 mg Oral QHS Sherian Rein, MD   20 mg at 04/03/15 2137  . ibuprofen (ADVIL,MOTRIN) tablet 600 mg  600 mg Oral Q6H PRN Leilani Able, MD      . ibuprofen (ADVIL,MOTRIN) tablet 800 mg  800 mg Oral 3 times per day Sherian Rein, MD   800 mg at 04/04/15 1511  . lactated ringers infusion   Intravenous Continuous Sherian Rein, MD 125 mL/hr at 04/03/15 2020    . lanolin ointment 1 application  1 application Topical PRN Jody Bovard-Stuckert, MD      .  menthol-cetylpyridinium (CEPACOL) lozenge 3 mg  1 lozenge Oral Q2H PRN Jody Bovard-Stuckert, MD      . nalbuphine (NUBAIN) injection 5 mg  5 mg Intravenous Q4H PRN Leilani Able, MD       Or  . nalbuphine (NUBAIN) injection 5 mg  5 mg Subcutaneous Q4H PRN Leilani Able, MD      . naloxone [facility]) 2 mg in dextrose 5 % 250 mL infusion  1-4 mcg/kg/hr Intravenous Continuous PRN Leilani Able, MD      . naloxone [facility]) injection 0.4 mg  0.4 mg Intravenous PRN Leilani Able, MD       And  . sodium chloride 0.9 % injection 3 mL  3 mL Intravenous PRN Leilani Able, MD      . ondansetron (ZOFRAN) injection 4 mg  4 mg Intravenous Q8H PRN Leilani Able, MD      . oxyCODONE-acetaminophen (PERCOCET/ROXICET) 5-325 MG per tablet 1 tablet  1 tablet Oral Q4H PRN Sherian Rein, MD   1 tablet at 04/03/15 2250  . oxyCODONE-acetaminophen (PERCOCET/ROXICET) 5-325 MG per tablet 2 tablet  2 tablet Oral Q4H PRN Sherian Rein, MD   2 tablet at 04/04/15 1511  . prenatal multivitamin tablet 1 tablet  1  tablet Oral Q1200 Sherian Rein, MD   1 tablet at 04/04/15 1211  . senna-docusate (Senokot-S) tablet 2 tablet  2 tablet Oral Q24H Sherian Rein, MD   2 tablet at 04/04/15 0005  . simethicone (MYLICON) chewable tablet 80 mg  80 mg Oral TID PC Jody Bovard-Stuckert, MD   80 mg at 04/04/15 1211  . simethicone (MYLICON) chewable tablet 80 mg  80 mg Oral Q24H Jody Bovard-Stuckert, MD   80 mg at 04/04/15 0005  . simethicone (MYLICON) chewable tablet 80 mg  80 mg Oral PRN Sherian Rein, MD   80 mg at 04/04/15 0816  . zolpidem (AMBIEN) tablet 5 mg  5 mg Oral QHS PRN Sherian Rein, MD         Objective: Vital signs in last 24 hours: Temp:  [97.8 F (36.6 C)-98.4 F (36.9 C)] 97.8 F (36.6 C) (07/23 0412) Pulse Rate:  [60-62] 62 (07/23 0412) Resp:  [16-18] 18 (07/23 0412) BP: (96-99)/(55-61) 99/61 mmHg (07/23 0412) SpO2:  [98 %-99 %] 98 % (07/23  0412)  Intake/Output from previous day: 07/22 0701 - 07/23 0700 In: 5647.5 [P.O.:600; I.V.:5047.5] Out: 4090 [Urine:3250; Drains:240; Blood:600] Intake/Output this shift: Total I/O In: -  Out: 900 [Urine:900]   Physical Exam  Constitutional: She is well-developed, well-nourished, and in no distress.  Genitourinary:  Urine light pink in tubing.   Vitals reviewed.   Lab Results:   Recent Labs  04/04/15 0605  WBC 12.6*  HGB 10.5*  HCT 32.7*  PLT 194   BMET No results for input(s): NA, K, CL, CO2, GLUCOSE, BUN, CREATININE, CALCIUM in the last 72 hours. PT/INR No results for input(s): LABPROT, INR in the last 72 hours. ABG No results for input(s): PHART, HCO3 in the last 72 hours.  Invalid input(s): PCO2, PO2  Studies/Results: No results found.  Lab reviewed.   Assessment and Plan: Intraoperative injury of bladder She is doing well with the foley and the urine is clearing. Her JP drainage is serous and minimal.   The drain can be removed and she can be discharged at the discretion of Dr. Ellyn Hack.  She will need f/u in my office in about a week for a cystogram and foley removal.       LOS: 1 day    [name] 04/04/2015

## 2015-04-05 MED ORDER — PRENATAL MULTIVITAMIN CH: 1.0000 | ORAL_TABLET | Freq: Every day | ORAL | Status: AC

## 2015-04-05 MED ORDER — OXYCODONE-ACETAMINOPHEN 5-325 MG PO TABS: 1.0000 | ORAL_TABLET | ORAL | Status: AC | PRN

## 2015-04-05 MED ORDER — IBUPROFEN 800 MG PO TABS: 800.0000 mg | ORAL_TABLET | Freq: Three times a day (TID) | ORAL | Status: AC

## 2015-04-05 NOTE — Progress Notes (Signed)
Subjective: [name]: Cesarean Delivery Patient reports incisional pain and tolerating PO.  Maintain foley.  D/C drain at D/C  Objective: Vital signs in last 24 hours: Temp:  [97.9 F (36.6 C)] 97.9 F (36.6 C) (07/24 0535) Pulse Rate:  [62] 62 (07/24 0535) Resp:  [18] 18 (07/24 0535) BP: (97)/(57) 97/57 mmHg (07/24 0535) SpO2:  [99 %] 99 % (07/24 0535)  Physical Exam:  General: alert and no distress Lochia: appropriate Uterine Fundus: firm Incision: healing well DVT Evaluation: No evidence of DVT seen on physical exam.  JP drain d/c'd, w/o complication  Recent Labs  04/04/15 0605  HGB 10.5*  HCT 32.7*    Assessment/Plan: Status post Cesarean section. Doing well postoperatively.  Continue current care.  Desires to have d/c to home.  Will d/c with motrin, percocet and PNV RN to teach re foley care.  To f/u with [name] re foley.    [name] 04/05/2015, 8:16 AM

## 2015-04-05 NOTE — Discharge Summary (Signed)
Obstetric Discharge Summary Reason for Admission: cesarean section Prenatal Procedures: none Intrapartum Procedures: cesarean: low cervical, transverse and bladder injury and repair Postpartum Procedures: none Complications-Operative and Postpartum: none HEMOGLOBIN  Date Value Ref Range Status  04/04/2015 10.5* 12.0 - 15.0 g/dL Final   HCT  Date Value Ref Range Status  04/04/2015 32.7* 36.0 - 46.0 % Final    Physical Exam:  General: alert and no distress Lochia: appropriate Uterine Fundus: firm Incision: healing well DVT Evaluation: No evidence of DVT seen on physical exam.  Discharge Diagnoses: Term Pregnancy-delivered  Discharge Information: Date: 04/05/2015 Activity: pelvic rest Diet: routine Medications: PNV, Ibuprofen and Percocet Condition: stable Instructions: refer to practice specific booklet Discharge to: home Follow-up Information    Follow up with [name], MD.   Specialty:  Urology   Why:  contact my office to schedule an appointment on in 7-10 days.   I will be out of the office so please ask to be seen by one of the nurse practitioners.    Contact information:   [address] [place] [address] [phone]       Follow up with Bovard-Stuckert, Augusto Gamble, MD. Schedule an appointment as soon as possible for a visit in 2 weeks.   Specialty:  Obstetrics and Gynecology   Why:  incision check, 6 weeks for full postpartum check   Contact information:   [address] [place] [address] [phone]       Newborn Data: Live born female  Birth Weight: 7 lb 11.8 oz (3510 g) APGAR: 9, 9  Home with mother.  Bovard-Stuckert, Annetta Deiss 04/05/2015, 9:07 AM

## 2015-04-06 ENCOUNTER — Encounter (HOSPITAL_COMMUNITY): Payer: Self-pay | Admitting: Obstetrics and Gynecology

## 2017-06-15 ENCOUNTER — Ambulatory Visit
Admission: RE | Admit: 2017-06-15 | Discharge: 2017-06-15 | Disposition: A | Discharge status: Home / Self Care | Payer: PRIVATE HEALTH INSURANCE | Source: Ambulatory Visit | Attending: Physician Assistant | Admitting: Physician Assistant

## 2017-06-15 ENCOUNTER — Other Ambulatory Visit: Payer: Self-pay | Admitting: Physician Assistant

## 2017-06-15 DIAGNOSIS — M549 Dorsalgia, unspecified: Secondary | ICD-10-CM

## 2017-06-15 DIAGNOSIS — R300 Dysuria: Secondary | ICD-10-CM

## 2017-06-15 MED ORDER — IOPAMIDOL (ISOVUE-370) INJECTION 76%
100.0000 mL | Freq: Once | INTRAVENOUS | Status: AC | PRN
  Administered 2017-06-15: 100 mL via INTRAVENOUS
